# Patient Record
Sex: Female | Born: 1975 | Race: White | Hispanic: No | Smoking: Former smoker
Health system: Southern US, Community
[De-identification: ages and names within clinical notes are randomized; demographics above are authoritative.]

## PROBLEM LIST (undated history)

## (undated) DIAGNOSIS — D693 Immune thrombocytopenic purpura: Secondary | ICD-10-CM

## (undated) DIAGNOSIS — D689 Coagulation defect, unspecified: Secondary | ICD-10-CM

## (undated) DIAGNOSIS — R011 Cardiac murmur, unspecified: Secondary | ICD-10-CM

## (undated) DIAGNOSIS — F419 Anxiety disorder, unspecified: Secondary | ICD-10-CM

## (undated) DIAGNOSIS — K589 Irritable bowel syndrome without diarrhea: Secondary | ICD-10-CM

## (undated) DIAGNOSIS — Z141 Cystic fibrosis carrier: Secondary | ICD-10-CM

## (undated) DIAGNOSIS — B001 Herpesviral vesicular dermatitis: Secondary | ICD-10-CM

## (undated) DIAGNOSIS — IMO0002 Reserved for concepts with insufficient information to code with codable children: Secondary | ICD-10-CM

## (undated) DIAGNOSIS — D649 Anemia, unspecified: Secondary | ICD-10-CM

## (undated) DIAGNOSIS — Z8742 Personal history of other diseases of the female genital tract: Secondary | ICD-10-CM

## (undated) HISTORY — DX: Coagulation defect, unspecified: D68.9

## (undated) HISTORY — PX: ABDOMINAL HYSTERECTOMY: SHX81

## (undated) HISTORY — DX: Cystic fibrosis carrier: Z14.1

## (undated) HISTORY — DX: Personal history of other diseases of the female genital tract: Z87.42

## (undated) HISTORY — DX: Anemia, unspecified: D64.9

## (undated) HISTORY — PX: OTHER SURGICAL HISTORY: SHX169

## (undated) HISTORY — DX: Herpesviral vesicular dermatitis: B00.1

## (undated) HISTORY — DX: Irritable bowel syndrome, unspecified: K58.9

## (undated) HISTORY — DX: Reserved for concepts with insufficient information to code with codable children: IMO0002

---

## 1997-02-21 DIAGNOSIS — R87619 Unspecified abnormal cytological findings in specimens from cervix uteri: Secondary | ICD-10-CM

## 1997-02-21 DIAGNOSIS — IMO0002 Reserved for concepts with insufficient information to code with codable children: Secondary | ICD-10-CM

## 1997-02-21 HISTORY — DX: Reserved for concepts with insufficient information to code with codable children: IMO0002

## 1997-02-21 HISTORY — DX: Unspecified abnormal cytological findings in specimens from cervix uteri: R87.619

## 1997-08-05 ENCOUNTER — Other Ambulatory Visit: Admission: RE | Admit: 1997-08-05 | Discharge: 1997-08-05 | Payer: Self-pay | Admitting: Obstetrics and Gynecology

## 1997-11-17 ENCOUNTER — Ambulatory Visit (HOSPITAL_COMMUNITY): Admission: RE | Admit: 1997-11-17 | Discharge: 1997-11-17 | Payer: Self-pay | Admitting: Family Medicine

## 1997-11-17 ENCOUNTER — Encounter: Payer: Self-pay | Admitting: Family Medicine

## 1998-09-22 ENCOUNTER — Other Ambulatory Visit: Admission: RE | Admit: 1998-09-22 | Discharge: 1998-09-22 | Payer: Self-pay | Admitting: Obstetrics and Gynecology

## 1999-02-22 HISTORY — PX: SPLENECTOMY: SUR1306

## 1999-05-17 ENCOUNTER — Encounter: Payer: Self-pay | Admitting: Internal Medicine

## 1999-05-17 ENCOUNTER — Ambulatory Visit (HOSPITAL_COMMUNITY): Admission: RE | Admit: 1999-05-17 | Discharge: 1999-05-17 | Payer: Self-pay | Admitting: Internal Medicine

## 1999-09-08 ENCOUNTER — Emergency Department (HOSPITAL_COMMUNITY): Admission: EM | Admit: 1999-09-08 | Discharge: 1999-09-08 | Payer: Self-pay | Admitting: Emergency Medicine

## 1999-09-08 ENCOUNTER — Inpatient Hospital Stay (HOSPITAL_COMMUNITY): Admission: AD | Admit: 1999-09-08 | Discharge: 1999-09-10 | Payer: Self-pay

## 1999-10-26 ENCOUNTER — Other Ambulatory Visit: Admission: RE | Admit: 1999-10-26 | Discharge: 1999-10-26 | Payer: Self-pay | Admitting: Obstetrics and Gynecology

## 1999-12-20 ENCOUNTER — Encounter (INDEPENDENT_AMBULATORY_CARE_PROVIDER_SITE_OTHER): Payer: Self-pay

## 1999-12-21 ENCOUNTER — Inpatient Hospital Stay (HOSPITAL_COMMUNITY): Admission: EM | Admit: 1999-12-21 | Discharge: 1999-12-24 | Payer: Self-pay

## 2000-08-31 ENCOUNTER — Other Ambulatory Visit: Admission: RE | Admit: 2000-08-31 | Discharge: 2000-08-31 | Payer: Self-pay | Admitting: Obstetrics and Gynecology

## 2000-09-19 ENCOUNTER — Ambulatory Visit (HOSPITAL_COMMUNITY): Admission: RE | Admit: 2000-09-19 | Discharge: 2000-09-19 | Payer: Self-pay | Admitting: Obstetrics and Gynecology

## 2000-09-19 ENCOUNTER — Encounter: Payer: Self-pay | Admitting: Obstetrics and Gynecology

## 2001-01-10 ENCOUNTER — Ambulatory Visit (HOSPITAL_COMMUNITY): Admission: RE | Admit: 2001-01-10 | Discharge: 2001-01-10 | Payer: Self-pay | Admitting: Obstetrics and Gynecology

## 2001-01-10 ENCOUNTER — Encounter: Payer: Self-pay | Admitting: Obstetrics and Gynecology

## 2001-01-31 ENCOUNTER — Ambulatory Visit (HOSPITAL_COMMUNITY): Admission: RE | Admit: 2001-01-31 | Discharge: 2001-01-31 | Payer: Self-pay | Admitting: Obstetrics and Gynecology

## 2001-01-31 ENCOUNTER — Encounter: Payer: Self-pay | Admitting: Obstetrics and Gynecology

## 2001-02-07 ENCOUNTER — Inpatient Hospital Stay (HOSPITAL_COMMUNITY): Admission: AD | Admit: 2001-02-07 | Discharge: 2001-02-07 | Payer: Self-pay | Admitting: Obstetrics and Gynecology

## 2001-02-09 ENCOUNTER — Encounter (INDEPENDENT_AMBULATORY_CARE_PROVIDER_SITE_OTHER): Payer: Self-pay | Admitting: Specialist

## 2001-02-09 ENCOUNTER — Inpatient Hospital Stay (HOSPITAL_COMMUNITY): Admission: AD | Admit: 2001-02-09 | Discharge: 2001-02-12 | Payer: Self-pay | Admitting: Obstetrics and Gynecology

## 2001-02-13 ENCOUNTER — Encounter: Admission: RE | Admit: 2001-02-13 | Discharge: 2001-03-15 | Payer: Self-pay | Admitting: Obstetrics and Gynecology

## 2001-02-15 ENCOUNTER — Inpatient Hospital Stay (HOSPITAL_COMMUNITY): Admission: AD | Admit: 2001-02-15 | Discharge: 2001-02-15 | Payer: Self-pay | Admitting: Obstetrics and Gynecology

## 2001-02-15 ENCOUNTER — Encounter: Payer: Self-pay | Admitting: Obstetrics and Gynecology

## 2001-02-28 ENCOUNTER — Ambulatory Visit (HOSPITAL_COMMUNITY): Admission: RE | Admit: 2001-02-28 | Discharge: 2001-02-28 | Payer: Self-pay | Admitting: Obstetrics and Gynecology

## 2001-02-28 ENCOUNTER — Encounter: Payer: Self-pay | Admitting: Obstetrics and Gynecology

## 2001-05-23 ENCOUNTER — Ambulatory Visit (HOSPITAL_COMMUNITY): Admission: RE | Admit: 2001-05-23 | Discharge: 2001-05-23 | Payer: Self-pay | Admitting: Obstetrics and Gynecology

## 2001-05-23 ENCOUNTER — Encounter: Payer: Self-pay | Admitting: Obstetrics and Gynecology

## 2001-09-06 ENCOUNTER — Other Ambulatory Visit: Admission: RE | Admit: 2001-09-06 | Discharge: 2001-09-06 | Payer: Self-pay | Admitting: Obstetrics and Gynecology

## 2002-08-23 ENCOUNTER — Ambulatory Visit (HOSPITAL_COMMUNITY): Admission: RE | Admit: 2002-08-23 | Discharge: 2002-08-23 | Payer: Self-pay | Admitting: Obstetrics and Gynecology

## 2002-08-23 ENCOUNTER — Encounter: Payer: Self-pay | Admitting: Obstetrics and Gynecology

## 2002-09-27 ENCOUNTER — Other Ambulatory Visit: Admission: RE | Admit: 2002-09-27 | Discharge: 2002-09-27 | Payer: Self-pay | Admitting: Obstetrics and Gynecology

## 2003-01-01 ENCOUNTER — Encounter (INDEPENDENT_AMBULATORY_CARE_PROVIDER_SITE_OTHER): Payer: Self-pay | Admitting: Interventional Cardiology

## 2003-01-01 ENCOUNTER — Observation Stay (HOSPITAL_COMMUNITY): Admission: AD | Admit: 2003-01-01 | Discharge: 2003-01-02 | Payer: Self-pay | Admitting: Obstetrics and Gynecology

## 2003-01-07 ENCOUNTER — Encounter (INDEPENDENT_AMBULATORY_CARE_PROVIDER_SITE_OTHER): Payer: Self-pay

## 2003-01-07 ENCOUNTER — Inpatient Hospital Stay (HOSPITAL_COMMUNITY): Admission: AD | Admit: 2003-01-07 | Discharge: 2003-01-10 | Payer: Self-pay | Admitting: Obstetrics and Gynecology

## 2003-01-11 ENCOUNTER — Encounter: Admission: RE | Admit: 2003-01-11 | Discharge: 2003-02-10 | Payer: Self-pay | Admitting: Obstetrics and Gynecology

## 2003-01-16 ENCOUNTER — Inpatient Hospital Stay (HOSPITAL_COMMUNITY): Admission: AD | Admit: 2003-01-16 | Discharge: 2003-01-16 | Payer: Self-pay | Admitting: Obstetrics and Gynecology

## 2004-06-24 ENCOUNTER — Other Ambulatory Visit: Admission: RE | Admit: 2004-06-24 | Discharge: 2004-06-24 | Payer: Self-pay | Admitting: Obstetrics and Gynecology

## 2004-12-23 ENCOUNTER — Encounter: Admission: RE | Admit: 2004-12-23 | Discharge: 2004-12-23 | Payer: Self-pay | Admitting: Obstetrics and Gynecology

## 2004-12-29 ENCOUNTER — Encounter: Admission: RE | Admit: 2004-12-29 | Discharge: 2004-12-29 | Payer: Self-pay

## 2005-01-18 ENCOUNTER — Ambulatory Visit: Payer: Self-pay | Admitting: Infectious Diseases

## 2005-01-21 ENCOUNTER — Ambulatory Visit: Payer: Self-pay | Admitting: Infectious Diseases

## 2005-02-11 ENCOUNTER — Ambulatory Visit: Payer: Self-pay | Admitting: Infectious Diseases

## 2005-02-19 ENCOUNTER — Encounter: Admission: RE | Admit: 2005-02-19 | Discharge: 2005-02-19 | Payer: Self-pay | Admitting: Internal Medicine

## 2005-03-31 ENCOUNTER — Ambulatory Visit: Payer: Self-pay | Admitting: Infectious Diseases

## 2005-09-19 ENCOUNTER — Other Ambulatory Visit: Admission: RE | Admit: 2005-09-19 | Discharge: 2005-09-19 | Payer: Self-pay | Admitting: Obstetrics and Gynecology

## 2005-10-07 ENCOUNTER — Encounter: Admission: RE | Admit: 2005-10-07 | Discharge: 2005-10-07 | Payer: Self-pay

## 2008-08-12 ENCOUNTER — Encounter (INDEPENDENT_AMBULATORY_CARE_PROVIDER_SITE_OTHER): Payer: Self-pay | Admitting: Obstetrics & Gynecology

## 2008-08-12 ENCOUNTER — Ambulatory Visit (HOSPITAL_COMMUNITY): Admission: RE | Admit: 2008-08-12 | Discharge: 2008-08-12 | Payer: Self-pay | Admitting: Obstetrics & Gynecology

## 2010-03-23 ENCOUNTER — Other Ambulatory Visit (HOSPITAL_COMMUNITY): Payer: Self-pay | Admitting: Obstetrics and Gynecology

## 2010-03-23 DIAGNOSIS — N979 Female infertility, unspecified: Secondary | ICD-10-CM

## 2010-03-30 ENCOUNTER — Other Ambulatory Visit (HOSPITAL_COMMUNITY): Payer: Self-pay | Admitting: Obstetrics and Gynecology

## 2010-03-30 ENCOUNTER — Ambulatory Visit (HOSPITAL_COMMUNITY)
Admission: RE | Admit: 2010-03-30 | Discharge: 2010-03-30 | Disposition: A | Discharge status: Home / Self Care | Payer: BC Managed Care – PPO | Source: Ambulatory Visit | Attending: Obstetrics and Gynecology | Admitting: Obstetrics and Gynecology

## 2010-03-30 DIAGNOSIS — N979 Female infertility, unspecified: Secondary | ICD-10-CM | POA: Insufficient documentation

## 2010-03-31 ENCOUNTER — Ambulatory Visit (HOSPITAL_COMMUNITY): Payer: BC Managed Care – PPO

## 2010-04-12 ENCOUNTER — Ambulatory Visit (HOSPITAL_COMMUNITY): Payer: BC Managed Care – PPO

## 2010-05-31 LAB — ABO/RH: ABO/RH(D): A POS

## 2010-05-31 LAB — CBC
MCHC: 34 g/dL (ref 30.0–36.0)
MCV: 93.7 fL (ref 78.0–100.0)
RBC: 3.61 MIL/uL — ABNORMAL LOW (ref 3.87–5.11)

## 2010-07-06 NOTE — Op Note (Signed)
NAME:  Mary Duffy, Mary Duffy           ACCOUNT NO.:  [id number]   MEDICAL RECORD NO.:  [id number]          PATIENT TYPE:  AMB   LOCATION:  SDC                           FACILITY:  [facility]   PHYSICIAN:  Ilda Mori, M.D.   DATE OF BIRTH:  Mar 24, 1975   DATE OF PROCEDURE:  08/12/2008  DATE OF DISCHARGE:                               OPERATIVE REPORT   PREOPERATIVE DIAGNOSIS:  Voluntary termination of pregnancy.   POSTOPERATIVE DIAGNOSIS:  Voluntary termination of pregnancy.   PROCEDURE:  Dilatation and evacuation.   SURGEON:  Ilda Mori, MD   ANESTHESIA:  Paracervical block with IV sedation.   FINDINGS:  The uterus was mid position and top normal size.  On D and E,  products of conception were noted consistent with a 6-7 week  intrauterine pregnancy.   SPECIMENS:  POC and they were sent to pathology for confirmation.   ESTIMATED BLOOD LOSS:  Minimal.   COMPLICATIONS:  There were no complications.   INDICATIONS:  A 35 year old gravida 3, para 2 female, who developed  unplanned and unwanted intrauterine pregnancy, which was confirmed by  ultrasound on August 02, 2006.  According to the patient's wishes, she  presents for voluntary termination of pregnancy.   PROCEDURE:  The patient was taken to the operating room and placed in a  dorsal lithotomy position, where IV sedation was administered and the  perineum and vagina were prepped and draped in the sterile fashion.  A  10 mL of 2% lidocaine was infiltrated in the paracervical tissues.  The  anterior lip of the cervix was grasped with single-tooth tenaculum.  The  internal os was dilated with Shawnie Pons dilators to a 21-French and #7  suction curette was introduced into the endometrial cavity and the  products of conception were evacuated.  The procedure was then  terminated and the patient left the operating room in good condition.      Ilda Mori, M.D.  Electronically Signed     RK/MEDQ  D:  08/12/2008  T:  08/13/2008   Job:  [id number]

## 2010-07-09 NOTE — H&P (Signed)
NAME:  Mary Duffy, Mary Duffy NO.:  [id number]   MEDICAL RECORD NO.:  [id number]                   PATIENT TYPE:  MAT   LOCATION:  MATC                                 FACILITY:  [facility]   PHYSICIAN:  Osborn Coho, M.D.                DATE OF BIRTH:  [birth year]   DATE OF ADMISSION:  01/07/2003  DATE OF DISCHARGE:                                HISTORY & PHYSICAL   HISTORY:  This is a 35 year old gravida 2, para 1, 0, 0, 1, at 36-3/7th  weeks, who presents for an evaluation of labor.  She was seen at the office  with mild regular contractions, and a cervical examination of 170 and -2.  She denies leaking or bleeding, and reports positive fetal movement.  The  pregnancy has been followed by the M.D. service, and is remarkable for:  ITP, history of IUGR, history of abnormal Pap, previous C-section, desires a  repeat and Group-B Streptococcus positive.   Her OB history is remarkable for a low transverse cesarean section in 2002,  of a female infant at [redacted] weeks gestation, weighing 5 pounds and 9 ounces,  remarkable for IUGR and ITP, and a postoperative hematoma.   PAST MEDICAL HISTORY:  1. Remarkable for a history of an abnormal Pap.  2. Childhood varicella.  3. History of ITP since age 84.  4. History of a blood transfusion in 2001.  5. History of IBS with constipation.  6. History of a heart murmur which she was told was resolved, and no longer     a problem.  7. 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   PAST SURGICAL HISTORY:  1. Remarkable for a 1992, bone spur removed from her right ankle.  2. 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  3. In 2002, she had a cesarean section.   FAMILY HISTORY:  Remarkable for heart disease in her grandfather.  Hypertension in her mother, father and grandfather.  ITP in her brother.  Hypothyroidism in her mother.  Breast cancer in her grandmother.   Lung  cancer in her other grandmother.  Bipolar disorder in her father.   GENETIC HISTORY:  Remarkable for the patient being a carrier of cystic  fibrosis.   SOCIAL HISTORY:  Remarkable for the patient being married to Martelle, who is  involved and supportive.  She is of the [employer].  She works as an  Chemical engineer.  She denies any alcohol, tobacco, or drug use, although she  has smoked cigarettes in the past.   PRE-NATAL LABORATORY DATA:  Hemoglobin 12.1, platelets initially 98,000, and  last week they were 92,000.  Blood type A-positive, antibody screen  negative.  RPR nonreactive.  Rubella immune.  Hepatitis negative.  HIV  negative.  Gonorrhea negative.  Chlamydia negative.  Cystic fibrosis  positive.  Husband is cystic fibrosis negative.  Clot screen within normal  limits.  Group-B strep positive.   PHYSICAL EXAMINATION:  VITAL SIGNS:  Stable.  HEENT:  Within normal limits.  NECK:  Thyroid normal and not enlarged.  CHEST:  Clear to auscultation.  HEART:  A regular rate and rhythm.  ABDOMEN:  Gravid, 36 cm, vertex to Leopold's.  EFM shows a reactive fetal  heart rate with uterine contractions every three to five minutes.  PELVIC:  Cervical examination changed from 170 and -2 and posterior, to 275  mid-positive and -1 to -2, with a vertex presentation.  EXTREMITIES:  Within normal limits.   LABORATORY DATA:  A CBC shows a white count of 15.8, hemoglobin 11.6,  platelet count 92,000.   ASSESSMENT:  1. Anterior uterine pregnancy at 36-3/7th weeks.  2. Latent phase labor.  3. Idiopathic thrombocytopenic purpura.  4. Previous cesarean section, desires repeat.   PLAN:  Dr. Osborn Coho consulted and will proceed with a repeat low  transverse cesarean section, and further orders to follow.     Marie L. Williams, C.N.M.                 Osborn Coho, M.D.    MLW/MEDQ  D:  01/07/2003  T:  01/07/2003  Job:  [id number]

## 2010-07-09 NOTE — Discharge Summary (Signed)
   Mary Duffy, Mary Duffy                       ACCOUNT NO.:  [id number]   MEDICAL RECORD NO.:  [id number]                   PATIENT TYPE:   LOCATION:                                       FACILITY:   PHYSICIAN:  Hal Morales, M.D.             DATE OF BIRTH:   DATE OF ADMISSION:  01/01/2003  DATE OF DISCHARGE:  01/02/2003                                 DISCHARGE SUMMARY   ADMITTING DIAGNOSES:  1. Intrauterine pregnancy at 35-5/7 weeks.  2. Idiopathic thrombocytopenia.  3. Shortness of breath and complaints of palpitations.   DISCHARGE DIAGNOSES:  1. Intrauterine pregnancy at 35-6/7 weeks.  2. Resolved complaints of shortness of breath and palpitations.  3. Known diagnosis of mild congenital pulmonary stenosis.  4. Known diagnosis of idiopathic thrombocytopenia.   PROCEDURE:  None.   HOSPITAL COURSE:  Ms. Mary Duffy is a 35 year old married white female gravida  2, para 1-0-0-1 at 35-5/[redacted] weeks gestation with a known diagnosis of mild  pulmonary stenosis admitted for observation secondary to complaints of  increasing shortness of breath and increasing episodes of palpitations that  did not resolve with rest.  She was initially evaluated at [facility]  and then sent to [facility] for a 2-D echocardiogram and cardiology  consult.  That consult is still in process but her symptoms are improved  today.  She has no further shortness of breath or palpitations.  She has  been on continuous cardiac monitoring and has had no episodes of any  irregularities.  She has had a pulse between 96-120s throughout her hospital  stay.  Her O2 saturations are 95-97% and her fetal heart rate is reactive  and reassuring.  She has no regular uterine contractions.  Her platelets are  stable at 92,000.  She is requesting discharge and will be discharged upon  consent from cardiology.   DISCHARGE INSTRUCTIONS:  She is to call for signs and symptoms of labor,  vaginal bleeding, leaking,  increasing palpitations, or shortness of breath.   DISCHARGE FOLLOWUP:  Next week at [facility] on November 17.   DISCHARGE LABORATORIES:  Her CK-MB was 1.7, creatinine kinase 62.  TSH  0.992.  Her hemoglobin is 11.4, WBC count 13.7, platelets 92,000.  Her SGOT  is 18, SGPT less than 19.   DISCHARGE MEDICATIONS:  Continue on prenatal vitamins.     Mary Duffy, C.N.M.              Hal Morales, M.D.    SJD/MEDQ  D:  01/02/2003  T:  01/02/2003  Job:  [id number]

## 2010-07-09 NOTE — H&P (Signed)
NAME:  Mary Duffy, Mary Duffy                     ACCOUNT NO.:  [id number]   MEDICAL RECORD NO.:  [id number]                   PATIENT TYPE:  INP   LOCATION:  9374                                 [facility]ACILITY:  [facility]   PHYSICIAN:  Janine Limbo, M.D.            DATE OF BIRTH:  09/01/1975   DATE OF ADMISSION:  01/01/2003  DATE OF DISCHARGE:                                HISTORY & PHYSICAL   SUBJECTIVE:  Mrs. Mary Duffy is a 35 year old female gravida 2, para 1 at [redacted]  weeks gestation who presented to the office today with complaints of  shortness of breath and palpitations.  She has had similar symptoms in the  past but they usually would resolve and the difference today is that it did  not resolve.  Her pregnancy has been followed by the [facility]  MD service and has been remarkable for (1) history of IUGR, (2) history of  ITP, (3) history of abnormal Pap smear, (4) previous C-section desires  repeat, (5) history of palpitations.  Patient had a 2-D echo performed  approximately 3-4 months ago and was diagnosed with mild pulmonic stenosis  at that time with no further treatment.  She also wore an event monitor due  to her history of palpitations and during a 2-week time period she was  diagnosed with benign ectopic.  Patient had a negative cardiology evaluation  at [facility] this afternoon with a normal 2-D echo.   Her prenatal labs were collected on Jul 02, 2002.  Hemoglobin 12.1,  hematocrit 37.6, platelets 98,000, blood type A positive, antibody negative,  RPR nonreactive, rubella immune, hepatitis B surface antigen negative, HIV  nonreactive, gonorrhea negative, Chlamydia negative, cystic fibrosis was  positive but the husband was negative.  Platelets on July 31, 2002 were  90,000 and on August 28, 2002 were 82,000 and on September 27, 2002 her platelets  were 68,000.  Her 1-hour glucola from October 31, 2002 was 85 and her RPR  was nonreactive at that time.  Platelets on  December 09, 2002 were 92,000,  and platelets on January 01, 2003 were 92,000.   HISTORY OF PRESENT PREGNANCY:  She presented for care at [facility] on  Jul 01, 2002 at [redacted] weeks gestation by last menstrual period but 9-1/[redacted] weeks  gestation by ultrasound performed that day.  Patient expressed a desire to  have a repeat C-section versus a trial of labor.  Ultrasonography at [redacted]  weeks gestation shows growth consistent with previous dating.  At [redacted] weeks  gestation she reports palpitations.  At that time it was also recommended  that patient be seen by her hematologist at [facility] to consider therapy for her  thrombocytopenia.  Patient was able to see Dr. Armanda Magic in the  cardiology department.  She was placed on an event monitor.  Pregnancy  ultrasonography at [redacted] weeks gestation shows growth consistent with previous  dating.  Patient at 28  weeks reported that palpitations were less frequent.  Pregnancy ultrasonography at [redacted] weeks gestation shows growth consistent with  previous dating, EFW at 50%.  Rest of her prenatal care was unremarkable  until she presented this morning with the sudden onset of shortness of  breath.   PAST OBSTETRICAL HISTORY:  She is a gravida 2, para 1-0-0-1.  In December  2002 she had a cesarean section for a female infant weighing 5 pounds 9  ounces at [redacted] weeks gestation.  She had a spinal for anesthesia.  Infant's  name was Mary Duffy and was delivered by Dr. Dierdre Forth due to IUGR.  She  had a postop hematoma due to her ITP.   MEDICAL HISTORY:  She has no medication allergies.  She reports having had  the usual childhood illnesses.  She has used oral contraceptives in the  past.  She had an abnormal Pap smear in 1999 for which she received a  colposcopy and they have been within normal limits since.  She also has a  history of ITP since age 40.  She received a blood transfusion of 2 units in  2001.  She reports history of irritable bowel syndrome.    SURGICAL HISTORY:  Ankle surgery to remove a bone spur in 1992 as well as a  splenectomy in 2001 and a C-section in 2002.   FAMILY MEDICAL HISTORY:  Maternal grandfather with history of heart disease;  multiple family members with hypertension; patient's brother has ITP as  well;  mother has hypothyroidism; maternal grandmother with breast cancer;  paternal grandmother with lung cancer; father is bipolar.  Patient is a  previous smoker, she stopped in 2002.   GENETIC HISTORY:  Genetic history is remarkable for patient with heart  murmur that resolved and patient being a carrier of cystic fibrosis with  father of the baby negative.   SOCIAL HISTORY:  Patient is married to the father of the baby.  His name is  Jacquenette Shone.  He is involved and supportive.  They are both college educated and  employed full time.  They deny any alcohol, tobacco, or illicit drug use  with the pregnancy.   OBJECTIVE DATA:  VITAL SIGNS:  Stable; she is afebrile.  HEENT:  Grossly within normal limits.  CHEST:  Clear to auscultation.  HEART:  Regular rate and rhythm.  ABDOMEN:  Gravid in contour with fundal height extending approximately 36 cm  above pubic symphysis.  Electronic fetal monitoring is reactive and  reassuring with occasional uterine contractions.  PELVIC:  Cervix is closed and posterior per CNM in office, vertex, -1.   LABORATORIES:  Group B strep, gonorrhea, and Chlamydia are pending.  Telemetry shows sinus tachycardia.   ASSESSMENT:  1. Intrauterine pregnancy at 35 weeks.  2. Pulmonary stenosis with previous shortness of breath and palpitations.  3. Idiopathic thrombocytopenic purpura.   PLAN:  Admit to adult intensive care unit for 24 hours of telemetry  monitoring as well as continuous fetal monitoring.     Cam Hai, C.N.M.                     Janine Limbo, M.D.    KS/MEDQ  D:  01/01/2003  T:  01/01/2003  Job:  [id number]

## 2010-07-09 NOTE — Consult Note (Signed)
[place]  Patient:    Mary Duffy, Mary Duffy                     MRN: [id number] Proc. Date: 09/08/99 Adm. Date:  [id number] Attending:  Armandina Gemma Dictator:   Cheree Ditto                          Consultation Report  REFERRING PHYSICIAN:  Armandina Gemma, M.D.  REASON FOR CONSULTATION:  Headache, neck stiffness and fever.  HISTORY OF PRESENT ILLNESS:  This is an initial inpatient consultation on this 35 year old female with persistent thrombocytopenia thought to be due to ITP versus a congenital abnormality, who has undergone several treatments for ITP, most recently infusion of intravenous immunoglobulin on July 16 and July 17. She reports that she was well prior to these infusions.  On the night of July 16 she had a mild headache which was relieved with Tylenol.  Over the course of the second infusion and afterwards the headache worsened and she also notes that her neck was stiff.  She went to sleep and woke up and her headache has still remained about the same since that time.  She also reports nausea and some myalgias in the hip girdle.  She denies photophobia but does say it hurts to move her eyes.  She has felt feverish and temperature at home has been as high as 102.  She denies any rash.  She denies numbness, weakness, paraesthesias, or any pain in the other parts of her body.  She has not been confused and her mental status has been entirely intact.  She saw Dr. Phylliss Bob today and was subsequently admitted over a concern of possibly meningitis. She has not been around anyone who has been ill.  PAST MEDICAL HISTORY:  ITP as above.  FAMILY HISTORY:  Brother also has low platelets problem, ITP.  SOCIAL HISTORY:  She denies alcohol or tobacco use.  She is engaged.  MEDICATIONS:  Multivitamins.  PHYSICAL EXAMINATION:  GENERAL:  In general she is a well-nourished appearing female, who is lying quietly in a dark room.  She  appears mildly distressed due to pain.  HEENT:  Head:  Cranium is normocephalic and atraumatic.  Oropharynx is benign.  NECK:  Moderately stiff with some tenderness.  There is no Kernig or Babinski sign.  NEUROLOGICAL:  Mental status:  She is fully awake and alert.  She is completely oriented to person, place, time, and situation.  Short- and long-term memory, concentration and language are all intact.  She can abstract and calculate without difficulty.  Cranial nerves:  Discs are sharp. Spontaneous venous pulsations are present.  On the funduscopic examination pupils are equal, round and briskly reactive to light.  Extraocular movements are full.  Face is symmetrical.  Tongue and palate move briskly in the midline.  Motor:  Normal bulk and tone.  No atrophy or fasciculations.  Normal strength in all limbs.  Sensation is intact to light touch and vibration in all extremities.  Coordination is normal.  Cerebellar examination reveals normal finger-to-nose.  Gait is intact.  Deep tendon reflexes are 2+.  Toes are downgoing.  There are no pathological reflexes.  LABORATORY DATA:  CBC, coagulation studies, blood cultures and CT of the head are all pending at this time.  IMPRESSION:  A 35 year old female, status post intravenous immunoglobulin infusion over the last two days, now with fever, headache and  neck stiffness. Concern is for meningitis.  The only way to definitively exclude infectious meningitis would be cerebrospinal fluid examination but there is concern over performing lumbar puncture due to her hematologic disease.  However, given her present clinical status including completely normal mental status and no signs of increased intracranial pressure on examination, my clinical suspicion for infectious meningitis is low, and this most likely represents a systemic reaction to the gamma globulin infusion.  RECOMMENDATIONS: 1. Followup head CT. 2. Neuro checks q.4h. 3. Would  proceed with LP if her platelet count would permit this, however, if    it is low enough to be of concern and head CT is unremarkable it is    reasonable to defer it as long as she remains neurologically normal. 4. Will continue to follow. DD:  09/08/99 TD:  09/09/99 Job: [id number]

## 2010-07-09 NOTE — H&P (Signed)
[place]  Patient:    Mary Duffy, Mary Duffy                       MRN: [id number] Adm. Date:  09/08/99 Attending:  W. Chase Picket, M.D. Dictator:   Filomena Jungling, RN, GNP                         History and Physical  DATE OF BIRTH:  09/11/1975  CHIEF COMPLAINT:  Neck stiffness and fever.  HISTORY OF PRESENT ILLNESS:  The patient is a 35 year old single black female who is followed by Dr. Phylliss Bob in Hematology Clinic.  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  She does not have any accompanying rheumatology problems of note.  She has had a positive ANA but her ENA panel was normal.  She has had thrombocytopenia since 1985 with a family history of thrombocytopenia in her brother.  She has had prior normal bone marrow biopsy in 1990 at [facility].  She has been seen by Dr. Aline August at [facility] and was treated in April with IV Anti D globulin. She did not have any response to that medication.  She was therefore treated this week on Monday and Tuesday with IV IgG.  After the first infusion she had some headache and fever.  This improved with Tylenol and Benadryl.  After the second infusion though she developed a headache and fever that got worse overnight.  Today she was unable to ambulate due to severe pain and stiffness in her neck and she was brought to our office.  She denies any recent upper respiratory infection.  No tick bite or rash.  No one else in her family or close friends have been ill lately.  She denies any bruising or bleeding.  No vomiting or diarrhea.  No photophobia.  She complains mostly of pain and muscle tightness in her neck with some spasms in her back, legs and neck as well as a tremor.  There have been no other focal neurological signs.  She has not had any visual changes.  She has not had any vomiting.  The headache is mostly over the back of the head and the neck ache is causing  her more problem than the headache.  PAST MEDICAL HISTORY:  Fairly benign.  She has been evaluated for constipation by Dr. Juanda Chance.  She has had some mild pulmonary valve stenosis which is stable and quite mild.  Her past medical history is otherwise benign.  FAMILY HISTORY:  Notable for a similar condition in her brother.  SOCIAL HISTORY:  Negative for smoking or alcohol use.  She is engaged to be married.  She lives with her parents at the present time.  As noted, the only medication is multivitamin.  ALLERGIES:  She has no true drug allergies but does not aspirin.  REVIEW OF SYSTEMS:  Done with the patient as above.  She did have some hypotension yesterday.  This is somewhat better today. She could not tolerate any p.o. antiemetics today due to the nausea.  PHYSICAL EXAMINATION:  VITAL SIGNS:  Temperature 118/76, temperature 100.1, pulse 100, weight 121. DD:  09/08/99 TD:  09/08/99 Job: [id number]

## 2010-07-09 NOTE — Op Note (Signed)
[facility]  Patient:    Mary Duffy, Mary Duffy Visit Number: [id number] MRN: [id number]          Service Type: OBS Location: 910A 9115 [id number] Attending Physician:  Shaune Spittle Dictated by:   Maris Berger. Pennie Rushing, M.D. Proc. Date: 02/09/01 Admit Date:  02/09/2001                             Operative Report  PREOPERATIVE DIAGNOSES: 1. Intrauterine pregnancy at [redacted] weeks gestation. 2. Intrauterine growth retardation. 3. Idiopathic thrombocytopenic purpura.  POSTOPERATIVE DIAGNOSES: 1. Intrauterine pregnancy at [redacted] weeks gestation. 2. Intrauterine growth retardation. 3. Idiopathic thrombocytopenic purpura.  OPERATION:                    Primary low transverse cesarean section.  SURGEON:                      Vanessa P. Pennie Rushing, M.D.  ASSISTANTVance Gather Duplantis, C.N.M.  ANESTHESIA:                   Spinal.  ESTIMATED BLOOD LOSS:         Less than 750 cc.  COMPLICATIONS:                None.  FINDINGS:                     The uterus, tubes and ovaries were normal for the gravid state.  The patient was delivered of a female infant whose name is Chloe, weighing 5 pounds 9 ounces, with Apgars of 9 and 9 at one and five minutes respectively.  The placenta contained an eccentrically inserted three-vessel cord.  DESCRIPTION OF PROCEDURE:     The patient was taken to the operating room after appropriate identification and placed on the operating table.  After placement of a spinal anesthetic, she was placed in the supine position with a left lateral tilt.  The abdomen and perineum were prepped with multiple layers of Betadine and a Foley catheter inserted into the bladder under sterile conditions and connected to straight drainage.  The abdomen was draped as a sterile field.  After assurance of adequate anesthesia, a transverse incision was made suprapubically and the abdomen opened in layers.  The peritoneum  was entered.  The uterus was incised approximately 2 cm above the uterovesical fold and the infant delivered from the occiput transverse position with the aid of a Kiwi vacuum extractor which was placed at 500 mmHg suction.  The infant had the nares and pharynx suctioned and the cord clamped and cut.  The infant was handed off to the awaiting pediatricians.  At the patients request, cord blood for cord blood banking was then obtained.  The placenta was then noted to have separated from the uterus and was removed from the operating field.  The uterine incision was closed with a running interlocking suture and 0 Vicryl.  An imbricating suture of 0 Vicryl was then placed. Hemostasis was noted to be adequate and copiously irrigated was carried out. The abdominal peritoneum was closed with a running suture of 2-0 Vicryl.  The rectus muscles were reapproximated in the midline with a figure-of-eight suture of 2-0 Vicryl.  The rectus muscles were made hemostatic with Bovie cautery and irrigated.  The rectus fascia was closed with a running suture of 0 Vicryl and reinforced on either side of midline with figure-of-eight sutures of 0 Vicryl.  The subcutaneous tissue was made hemostatic with Bovie cautery. Skin staples were applied to the skin incision and a sterile dressing applied. The patient was taken from the operating room to the recovery room in satisfactory condition having been tolerated the procedure well with sponge and instrument counts correct.  The infant went to the full-term nursery. The placenta was sent to pathology. Dictated by:   Maris Berger. Pennie Rushing, M.D. Attending Physician:  Shaune Spittle DD:  02/09/01 TD:  02/10/01 Job: [id number]

## 2010-07-09 NOTE — Discharge Summary (Signed)
NAME:  Mary Duffy, Mary Duffy                     ACCOUNT NO.:  [id number]   MEDICAL RECORD NO.:  [id number]                   PATIENT TYPE:  INP   LOCATION:  9110                                 FACILITY:  [facility]   PHYSICIAN:  Osborn Coho, M.D.                DATE OF BIRTH:  May 06, 1975   DATE OF ADMISSION:  01/07/2003  DATE OF DISCHARGE:  01/10/2003                                 DISCHARGE SUMMARY   ADMISSION DIAGNOSES:  1. Intrauterine pregnancy  at 36-3/7 weeks.  2. Latent phase labor.  3. Idiopathic thrombocytopenic purpura.  4. Previous cesarean section, desires repeat.   DISCHARGE DIAGNOSES:  1. Intrauterine pregnancy  at 36-3/7 weeks.  2. Latent phase labor.  3. Idiopathic thrombocytopenic purpura.  4. Previous cesarean section, desires repeat.  5. Status post repeat low transverse cesarean section for a live born female     infant with Apgars 9 and 9, weighing 5 pounds 11 ounces.   PROCEDURE:  1. Epidural anesthesia.  2. Repeat low transverse cesarean section.  3. Removal of scar tissue.   HOSPITAL COURSE:  The patient was admitted at 36-3/7 weeks for evaluation of  labor. Her cervix changed during the course of observation and it was deemed  that she was in latent phase labor. The patient had experienced a previous  cesarean section and was requesting a repeat cesarean section. She was taken  to the operating room and prepped for repeat cesarean section, which was  performed by Dr. Su Hilt under epidural anesthesia. Surgery was remarkable  for adhesions and removal of keloid scar tissue. A live born female infant  was delivered. Apgars 9 and 9. Weighing 5 pounds 11 ounces. Mother and baby  were both taken to the mother baby unit in stable condition. On  postoperative day 1, the patient was doing well except for some shoulder  pain with abdominal palpation. Her examination was found to be within normal  limits. Her hemoglobin dropped from 11.6 to 9.1 to 8.0 but the  patient  remained hemodynamically stable and did not experience any syncope. Platelet  count was initially 92,000 and fell to a nadir of 78,000. This will be  rechecked through her hematologist at a later time. On postoperative day 2,  the patient was doing well with up ad lib. Tolerating a regular diet and not  experiencing any complications. On postoperative day 3, the patient was  ready to go home.   PHYSICAL EXAMINATION:  VITAL SIGNS:  Vital signs were stable. She was  afebrile.  CHEST:  Clear.  HEART:  Regular rate and rhythm.  ABDOMEN:  Soft and appropriately tender. Incision was clean, dry and intact  with Silicone patch in place. Lochia was small.  EXTREMITIES:  Within normal limits.   DISPOSITION:  The patient was deemed to have received the full benefit of  her hospital stay and was discharged to home.   DISCHARGE MEDICATIONS:  1. Motrin 600  mg p.o. q. 6 hours p.r.n.  2. Tylox 1 to 2 p.o. q. 4 hours p.r.n.   DISCHARGE LABORATORY DATA:  White blood cell count 16.5. Hemoglobin 8.0.  Platelet count 78,000 with schistocytes and large platelets present.  Followup labs on January 10, 2003 were white blood cell count 16.4,  hemoglobin 8.4, platelet count is currently pending.   DISCHARGE INSTRUCTIONS:  Per [facility] and Gynecologic  Services handout.   FOLLOW UP:  In 6 weeks at [facility] and Gynecologic  Services and in January with hematology at [facility].     Marie L. Williams, C.N.M.                 Osborn Coho, M.D.    MLW/MEDQ  D:  01/10/2003  T:  01/10/2003  Job:  [id number]

## 2010-07-09 NOTE — Discharge Summary (Signed)
[facility]  Patient:    Mary Duffy, Mary Duffy                     MRN: [id number] Adm. Date:  [id number] Disc. Date: [id number] Attending:  Meredith Leeds CC:         Helene Kelp, M.D.  Aline August, M.D. 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  She had a number of therapies, including immunoglobulin and steroids.  She continues to have a low platelet count, and had no bleeding problem.  Latest platelet count was 31,000.  The patient was treated by Dr. Pascal Lux at [facility].  He had recommended splenectomy, and the patient was admitted to the hospital to have that done.  PAST MEDICAL HISTORY:  Unremarkable beyond the above.  MEDICATIONS:  Vitamins, occasional laxative and occasional Prilosec.  ALLERGIES:  ASPIRIN.  PHYSICAL EXAMINATION:  Totally unremarkable.  See history and physical for greater details.  HOSPITAL COURSE:  On the day of admission, under general anesthesia, she underwent laparoscopy and attempted laparoscopic splenectomy.  It was found that the short gastric vessels were numerous, and the stomach was extremely tightly adherent to the spleen.  Despite very careful dissection and lengthy effort, I was unable to successfully separate the stomach from the splenic hilum and the spleen, and bleeding ensued.  After adequate efforts to control the bleeding laparoscopically, I abandoned the laparoscopic approach and did open splenectomy.  The operation and recovery were unremarkable.  Platelet count rose to 275,000 with platelet transfusion, and had fallen to 190,000 at discharge.  The patient had no postoperative complications.  She is to see me at the office in 2 to 3 weeks.  Staples were removed prior to discharge.  The pathology on the spleen was unremarkable.  DIAGNOSIS:  Immune related  thrombocytopenia.  OPERATION:  Splenectomy.  CONDITION ON DISCHARGE:  Improved. DD:  01/24/00 TD:  01/24/00 Job: [id number]

## 2010-07-09 NOTE — H&P (Signed)
[facility]  Patient:    Mary Duffy, Mary Duffy                     MRN: [id number] Adm. Date:  [id number] Disc. Date: [id number] Attending:  Armandina Gemma                         History and Physical  CHIEF COMPLAINT:  Low platelets.  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  She has been treated by Dr. Lucas Mallow and by Dr. Aline August at [facility].  Steroids and immunoglobulins have been tried, but have not been successful in chronically raising her platelet count.  The IV immunoglobulin caused a febrile response.  She has been on prednisone; I believe that has been stopped.  Latest platelet count was 31,000.  The patient is felt to be a candidate for splenectomy by Dr. Pascal Lux and is seeing me and admitted for that purpose.  I have counseled her thoroughly for the possible complications including the need for an open procedure, although laparoscopic procedure is anticipated, bleeding, infectious complications in the postoperative period and chronically, and injury to the stomach.  She accepts the risks and asks that I perform the procedure.  PAST MEDICAL HISTORY:  The patients health is good.  Childhood illnesses: Unremarkable.  OUTPATIENT MEDICATIONS:  Vitamins.  She occasionally needs a laxative.  She sometimes takes Prilosec.  ALLERGIES:  She is allergic to ASPIRIN.  PAST SURGICAL HISTORY:  She has had an operation on her Achilles tendon and removal of a bone spur.  No other medical problems.  FAMILY HISTORY:  Unremarkable.  REVIEW OF SYSTEMS:  Unremarkable.  PHYSICAL EXAMINATION:  VITAL SIGNS:  Unremarkable per nursing staff.  GENERAL APPEARANCE:  The patient is a healthy-appearing young woman.  She is slender.  MENTAL STATUS:  Normal.  HEENT:  Unremarkable.  CHEST:  Clear to auscultation.  BREASTS:   Normal.  CARDIOVASCULAR:  Heart rate and rhythm normal, no murmur or gallop.  ABDOMEN:  No mass, tenderness, or organomegaly.  RECTAL/PELVIC:  Not done.  EXTREMITIES:  Normal.  NEUROLOGIC:  Normal.  SKIN:  Normal.  LYMPHATICS:  Lymph nodes not enlarged.  IMPRESSION:  Immune thrombocytopenic purpura.  PLAN:  Laparoscopic splenectomy. DD:  12/20/99 TD:  12/20/99 Job: [id number]

## 2010-07-09 NOTE — Op Note (Signed)
NAME:  Mary Duffy, Mary Duffy                     ACCOUNT NO.:  [id number]   MEDICAL RECORD NO.:  [id number]                   PATIENT TYPE:  INP   LOCATION:  9110                                 FACILITY:  [facility]   PHYSICIAN:  Osborn Coho, M.D.                DATE OF BIRTH:  22-May-1975   DATE OF PROCEDURE:  01/07/2003  DATE OF DISCHARGE:                                 OPERATIVE REPORT   PREOPERATIVE DIAGNOSES:  1. Intrauterine pregnancy at 36-4/7 weeks.  2. Latent labor.  3. History of idiopathic thrombocytopenic purpura.  4. Desires repeat elective cesarean section.   POSTOPERATIVE DIAGNOSES:  1. Intrauterine pregnancy at 36-4/7 weeks.  2. Latent labor.  3. History of idiopathic thrombocytopenic purpura.  4. Desires repeat elective cesarean section.   PROCEDURE:  Repeat low transverse cesarean section via Pfannenstiel at the  site of the prior scar, with vacuum assistance.   ANESTHESIA:  Epidural.   ATTENDING:  Osborn Coho, M.D.   ASSISTANT:  Elby Showers. Williams, C.N.M.   FLUIDS REPLACED:  3500 mL.   ESTIMATED BLOOD LOSS:  800 mL.   URINE OUTPUT:  400 mL.   COMPLICATIONS:  None.   FINDINGS:  A live female infant with Apgars of 9 at one minute and 9 at five  minutes.  Normal bilateral ovaries and tubes.   PATHOLOGY:  Placenta sent.   DESCRIPTION OF PROCEDURE:  The patient was taken to the operating room after  the risks, benefits, and alternatives were discussed with the patient and  the patient had verbalized understanding and consent signed and witnessed.  The patient was given an epidural per anesthesia and prepped and draped in  the normal sterile fashion.  A skin incision was made at the site of the  prior Pfannenstiel scar.  The incision was carried down to the underlying  layer of fascia and the fascia was excised bilaterally in the midline.  The  fascial incision was extended bilaterally with the Mayo scissors.  Straight  Kocher clamps were placed on the  superior aspect of the fascial incision and  the rectus muscle excised from the fascia.  The same was done on the  inferior aspect of the fascial incision.  As the patient was feeling some  discomfort during this part of the procedure, 3% Nesacaine was administered  topically to this area.  The muscle was separated in the midline with a  hemostat and the peritoneum entered with the Metzenbaum scissors after  flashing and noted to be clear.  The main portion of the muscle inferiorly  was separated with the Bovie.  The peritoneum was extended manually.  Bladder blade was placed and bladder flap created.  The lower uterine  segment was noted to be extremely thin with an apparent window.  The uterine  incision was made and copious clear fluid was noted.  The infant's head was  having some difficulty delivering just with fundal pressure and  vacuum  applied.  With vacuum assistance the head delivered without difficulty.  The  oropharynx and nasopharynx were bulb-suctioned.  A loose nuchal cord was  noted and reduced without difficulty.  The remainder of the infant was  delivered and the cord was clamped and cut.  The infant was handed to the  waiting pediatricians.  Cord bloods were sent.  The placenta was delivered  via fundal massage.  The uterine incision was repaired with 0 Vicryl in a  running locked fashion after clearing the uterus of all clots and debris.  A  second imbricating layer using 0 Vicryl was performed.  The bilateral  ovaries and fallopian tubes were noted and appeared within normal limits.  The intra-abdominal cavity was copiously irrigated.  With the patient's low  platelets, Surgicel was placed on the uterine incision, which was noted to  be hemostatic.  Prior to placing the Surgicel, 3-0 chromic was placed on the  posterior aspect of the bladder where there was some oozing noted.  The  Surgicel covered this area as well after being placed.  The muscle was  reapproximated  with 0 chromic using interrupteds x3.  The fascia was  repaired with 0 Vicryl in a running fashion.  The subcutaneous tissue was  made hemostatic with the Bovie.  The prior uterine scar was removed as it  was very thick and appeared to be either a keloid or hypertrophic scarring.  There was some bleeding noted after removal of the scar, and it was made  hemostatic with the Bovie.  Chromic 3-0 was used to reapproximate the  subcutaneous tissue using three interrupted stitches.  The subcuticular  layer was closed with 3-0 Monocryl.  A silicone gel patch dressing was  applied in order to avoid hypertrophy of the scar or keloiding.  The patient  was informed that she may again develop the type of scar that she developed  last time.  Pressure dressing was applied.  Sponge, lap, and needle count  was correct.  The patient tolerated the procedure well and was returned to  the recovery room in stable condition.                                               Osborn Coho, M.D.    AR/MEDQ  D:  01/07/2003  T:  01/08/2003  Job:  [id number]

## 2010-07-09 NOTE — H&P (Signed)
[place]  Patient:    Mary Duffy, Mary Duffy                       MRN: [id number] Adm. Date:  09/08/99 Attending:  W. Chase Picket, M.D. Dictator:   Filomena Jungling, RN, GNP                         History and Physical  DATE OF BIRTH:  Aug 26, 1975  CHIEF COMPLAINT:  Neck stiffness and fever.  HISTORY OF PRESENT ILLNESS:  The patient is a 35 year old single black female who is followed by Dr. Phylliss Bob in the rheumatology clinic.  She has a past history of chronic idiopathic thrombocytopenia.  She does not have any accompanying rheumatology problem of note.  She has had a positive ANA, but her ENA panel was normal.  She has had thrombocytopenia since 1985 with a family history of thrombocytopenia in her brother.  She has had prior normal bone marrow biopsy in 1990 at [facility]  She has been seen by Dr. Aline August at [facility] and was treated in April with IV anti-D globulin.  She did not have any response to that medication.  She was, therefore, treated this week on Monday and Tuesday with IV IgG.  After the first infusion, she had some headache and fever.  This improved with Tylenol and Benadryl.  After the second infusion, though, she developed a headache and fever that got worse overnight.  Today, she was unable to ambulate due to severe pain and stiffness in her neck, and she was brought to our office.  She denies any recent upper respiratory tract infection or tick bite or rash.  No one else in her family or close friends have been ill lately.  She denies any bruising or bleeding.  No vomiting or diarrhea.  No photophobia.  She complains mostly of pain and muscle tightness in her neck with some spasms in her back and legs and neck as well as a tremor.  There have been no other focal neurologic signs.  She has not had any visual changes.  She has not had any vomiting.  The headache is mostly over the back of the head, and  the neck ache is causing her more problem than the headache.  PAST MEDICAL HISTORY:  Fairly benign.  She has been evaluated for constipation by Dr. Juanda Chance.  She has had some mild pulmonary valve stenosis which is stable and quite mild.  Her past medical history is otherwise benign.  FAMILY HISTORY:  Notable for a similar condition in her brother.  SOCIAL HISTORY:  Negative for smoking or alcohol use.  She is engaged to be married but lives with her parents at the present time.  MEDICATIONS:  As noted, only medication is multivitamin.  ALLERGIES:  She has no true drug allergies but does not take ASPIRIN.  REVIEW OF SYSTEMS:  Done with the patient as above.  She did have some hypotension yesterday.  This is somewhat better today.  She could not tolerate any p.o. antiemetics today due to the nausea.  PHYSICAL EXAMINATION:  VITAL SIGNS:  Blood pressure 118/76, temperature 100.1, pulse 100, weight 121.  GENERAL:  She is alert and oriented and in no acute distress.  Initially as she was in the waiting room waiting to go to the hospital, she had an increase in her neck pain and then was  quite tearful.  Due to increasing pain and inability to ambulate, we did have to transfer her by ambulance.  NECK:  She had quite a bit of stiffness with flexion and extension.  She was able to do rotation fairly well.  No cervical adenopathy.  LUNGS:  Clear.  HEART:  Rate regular.  I do not hear a murmur.  ABDOMEN:  Totally benign.  No organomegaly or masses.  SKIN:  No petechiae or skin rash noted.  NEUROLOGIC:  Nonfocal.  She has no clonus.  Strength is normal and symmetric in the upper and lower extremities, and her DTRs are normal and symmetric. Grips are equal.  HEENT:  Funduscopic exam unremarkable.  ASSESSMENT/PLAN:  The patient was seen by Dr. Phylliss Bob today.  He did speak with Dr. Pascal Lux.  We will go ahead and admit her for fever and nuchal rigidity in order to rule out meningitis.  We  will go ahead and get a stat head CT, blood cultures, labs. She will be seen by Dr. Sharene Skeans or one of his partners.  She has seen Dr. Sharene Skeans before.  We will give her some IV Demerol and Phenergan.  We will not give her any nonsteroidals.   Will also get labs to include clotting time, platelet count, and metabolic panel. DD:  09/08/99 TD:  09/08/99 Job: [id number]

## 2010-07-09 NOTE — H&P (Signed)
[facility]  Patient:    Mary Duffy, PFARR Visit Number: [id number] MRN: [id number]          Service Type: OBS Location: [facility] Attending Physician:  Leonard Schwartz Dictated by:   Saverio Danker, C.N.M. Admit Date:  02/07/2001                           History and Physical  HISTORY OF PRESENT ILLNESS:   The patient is a 35 year old, primigravida at 39-2/7 weeks admitted for primary low transverse cesarean section secondary to history of idiopathic thrombocytopenia and IUGR. She denies any nausea, vomiting, headaches, or visual disturbances. She denies any leaking or vaginal bleeding.  PRENATAL COURSE:              Her pregnancy has been followed at [facility] by the MD Service and has been at risk for IUGR and history of idiopathic thrombocytopenia throughout this pregnancy. She also has some borderline blood pressure elevations and has been maintained on bed rest since December 11. Other risk factors for this pregnancy are history of abnormal Pap and cystic fibrosis carrier, though husband is negative.  OBSTETRIC/GYNCOLOGIC HISTORY:                      She is a primigravida with an LMP of April 24, 2000, given her [facility] of January 30, 2001, but by ultrasound her [facility] is February 16, 2001. Her periods were irregular every 6-8 weeks. She has had a history of thrombocytopenia prior to this pregnancy and has been followed by her hematologist at [facility] and has had platelet counts throughout this pregnancy. Her most recent platelet count was 52,000.  ALLERGIES:                    She is not able to take aspirin secondary to thrombocytopenia.  PAST MEDICAL HISTORY:         She reports having had the usual childhood diseases.  She reports, again, a history of thrombocytopenia diagnosed at age 35. She has had blood transfusions. She had 2 units in October of 2001. She has a history of irritable bowel syndrome and chronic  constipation, history of occasional urinary tract infections.  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  FAMILY HISTORY:               Significant for maternal grandfather with heart disease. Mother, father, maternal grandfather with hypertension, on medication. Brother also with thrombocytopenia and mother with hypothyroidism. Maternal grandmother with breast cancer. Paternal grandmother with lung cancer. Father has bipolar disorder.  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  SOCIAL HISTORY:               She is married to [employer], who is involved and supportive. They are both employed full-time. They are of the [employer] faith. They deny any illicit drug use, alcohol or smoking with this pregnancy, though she did smoke early on in her pregnancy.  PRENATAL LABORATORY DATA:     Her blood type is A positive.  Her antibody screen is negative.  Syphilis is nonreactive.  Rubella is immune.  Hepatitis B surface antigen is negative. HIV is nonreactive.  GC  and Chlamydia are both negative.  Pap is within normal limits.  Her one-hour Glucola was within normal range. Her maternal serum alpha-fetoprotein was also within normal range and her 36-week beta strep is negative.  PHYSICAL EXAMINATION:  VITAL SIGNS:                  Her blood pressure is 126/80. The rest of her vital signs are stable.  She is afebrile.  HEENT:                        Grossly within normal limits.  HEART:                        Regular rhythm and rate.  CHEST:                        Clear.  BREASTS:                      Soft and nontender.  ABDOMEN:                      Gravid with irregular mild uterine contractions. Her fetal heart rate is 140 per Doppler and the patient was sent to maternity admissions for an NST on February 07, 2001.  PELVIC:                        Her cervix was deferred at this time.  EXTREMITIES:                  Within normal limits.  ASSESSMENT:                   1. Intrauterine pregnancy, at 39-2/7 weeks.                               2. History of idiopathic thrombocytopenia.                               3. Intrauterine growth restriction.                               4. Desire primary low transverse cesarean                                  section.  PLAN:                         Admit for cesarean section per Dr. Dierdre Forth. Dictated by:   Vance Gather Duplantis, C.N.M. Attending Physician:  Leonard Schwartz DD:  02/07/01 TD:  02/07/01 Job: [id number]

## 2010-07-09 NOTE — Op Note (Signed)
[facility]  Patient:    Mary Duffy, Mary Duffy                     MRN: [id number] Proc. Date: 12/20/99 Adm. Date:  [id number] Disc. Date: [id number] Attending:  Armandina Gemma                           Operative Report  PREOPERATIVE DIAGNOSIS:  Immune thrombocytopenic purpura.  POSTOPERATIVE DIAGNOSIS:  Immune thrombocytopenic purpura.  OPERATION:  Splenectomy.  SURGEON:  Zigmund Daniel, M.D.  ASSISTANT:  Rose Phi. Maple Hudson, M.D.  ANESTHESIA:  General.  DESCRIPTION OF PROCEDURE:  After adequate general anesthesia and monitoring, insertion of Foley catheter, and positioning of the patient with the left side somewhat up but supine, I made an infraumbilical incision transversely, dissected through the fascia, entered the peritoneum bluntly, and put in a Hasson cannula.  I then after securing the Hasson cannula inflated the abdomen with CO2 and did laparoscopy.  I could easily see the spleen.  I saw no abnormalities of any organs.  There was no abnormality noted in the omentum, specifically no accessory spleen.  I then placed additional ports in the upper abdomen and placed the patient in Trendelenburg and retracted omentum and colon upward and inspected the small bowel and its mesentery and saw no evidence of an accessory spleen.  I placed two ports in the left lower quadrant, then tilted the patient sharply to the right and put the patient in sharp reverse Trendelenburg position.  I then pulled the colon and omentum downward, and the spleen came into view and I retracted the stomach to the right with a Babcock clamp, exposing the splenic hilum.  There were some adhesions of the splenic flexure of the colon to the lateral abdominal wall, and I took those down with the Harmonic scalpel.  I worked then medially and anteriorly, exposing the lower pole of the spleen but leaving the lateral attachments intact.  I worked cephalad along the peritoneum of the  anterior surface of the spleen.  It appeared that the patient had a diffuse distribution of splenic vessels into the hilum.  Staying quite close to the spleen, I took a couple of small lower pole vessels using the Harmonic scalpel and additionally a clip on the side remaining anteriorly.  As I worked upward, it was difficult to see the short gastric arteries because in the upper portion of the hilum along the superior pole of the spleen, the stomach was extremely close to the spleen.  It actually seemed to be adherent to it rather than giving any effect of mesentery or gastrosplenic ligament.  Careful use of the Harmonic scalpel to divide the peritoneum across the anterior aspect of the hilum and attempted to create a plane of dissection between the stomach and the spleen, so as to approach the superior aspect of the splenic hilum and the short gastric vessels.  While I was doing that dissection, I began to have bleeding in the hilar area of the spleen.  I attempted to control this bleeding by suctioning it away and the use of clips and Harmonic scalpel. After a goodly time spent trying to get control of the bleeding so that I could see for further dissection, I felt that I was not going to be able to safely control that bleeding and decided to abandon the laparoscopic technique.  We released the retractors, removed the band retractor from  the left lower quadrant, and removed the Babcock and then removed all the ports. I then made a short left subcostal incision utilizing one of the port sites and extending it just below the ribs laterally.  Then entered the abdomen after dividing the rectus muscles with cautery.  I put the patient in reverse Trendelenburg position and put my hand in and pulled the spleen anteriorly and cut the lateral attachments of the spleen to the diaphragm and retroperitoneum.  I then delivered the spleen upward.  In so doing, I felt that there was some tearing of the  short gastric vessels as they came up.  I placed a pack posteriorly.  I then dissected very carefully at the hilum and clamped, cut, and divided the splenic artery and vein.  I suture ligated those vessels with 2-0 silk.  I then placed a clamp across a portion of the short gastric vessels and suture ligated that with 2-0 silk.  I then very carefully inspected the area around the greater curvature of the stomach and superior portion of the hilum and found a couple of small bleeding vessels, which I ligated or suture ligated.  I then had good hemostasis.  I thoroughly irrigated the area and removed the irrigant, again checked hemostasis and found it to be excellent.  I then carefully inspected the area and found that there was no evidence of injury of the pancreatic tail or stomach wall.  I then removed the packs.  I then carefully inspected the omentum, area of splenic hilum, and small bowel mesentery, and found no evidence of any accessory spleen.  The patient remained stable throughout.  I closed the fascia with #1 PDS in two layers and closed the skin of all incisions with staples.  The umbilical incision was closed with the pursestring suture which had been used to secure the Hasson cannula.  The patient was stable throughout.  Sponge, needle, and instrument counts were correct.  The patient went to the recovery room in stable condition. DD:  12/20/99 TD:  12/20/99 Job: [id number]

## 2010-07-09 NOTE — Discharge Summary (Signed)
[facility] of [facility]  Patient:    Mary Duffy, ANCONA Visit Number: [id number] MRN: [id number]          Service Type: OBS Location: 910A [id number] [id number] Attending Physician:  Shaune Spittle Dictated by:   Nigel Bridgeman, C.N.M. Admit Date:  02/09/2001 Discharge Date: 02/12/2001                             Discharge Summary  ADMISSION DIAGNOSES:          1. Intrauterine pregnancy at term.                               2. Idiopathic thrombocytopenic purpura.                               3. Intrauterine growth retardation.  DISCHARGE DIAGNOSES:          1. Intrauterine pregnancy at term.                               2. Idiopathic thrombocytopenic purpura.                               3. Intrauterine growth retardation.                               4. Postpartum anemia.  PROCEDURES:                   1. Primary low transverse cesarean section.                               2. Spinal anesthesia.  HOSPITAL COURSE:              Ms. Roxanna Mew is a 35 year old gravida 1, para 0, at 71 weeks, who presented on February 09, 2001 for primary low transverse cesarean section, scheduled secondary to idiopathic thrombocytopenic purpura and IUGR.  Patients platelets had reached a Nader of approximately 52,000 at 38 weeks.  On admission, her platelet count was 114,000.  She was taken to the operating room, where a primary low transverse cesarean section was performed with ______ by Dr. Pennie Rushing with a delivery of a viable female by the name of Chloe.  Weight 5 pounds 9 ounces.  Apgars were 9 and 9.  Infant was taken to the full term nursery.  Mother was taken to the recovery room in good condition.  Estimated blood loss was less than 750 cc.  By postoperative day #1, patient had been out of bed some.  There was some lightheadedness.  Her temperature was normal.  Blood pressure was 140/88.  CBC showed hemoglobin of 8.3, with a 12 preoperative value.  WBC was 17,000 and  platelets were 105,000. Urine was examined for protein, which was negative.  Orthostatic blood pressure and pulses were checked, which were normal.  CBC was rechecked on the following day, with a platelet count of 105,000, hemoglobin of 8.3 and WBC count of 17.6.  These were repeated on  February 11, 2001, with a hemoglobin of 7.8, platelet count of 214,000 and WBC count of 22.8.  She remained afebrile.  The rest of her hospital course was uncomplicated.  Her incision maintained itself as intact.  By postoperative day #3, she was up ad lib and doing well.  She was having no syncope.  Baby was breast-feeding.  She was undecided regarding contraception.  The patient was deemed to receive the full benefit of her hospital stay and was discharged home.  DISCHARGE INSTRUCTIONS:       Per [facility] handout.  DISCHARGE MEDICATIONS:        1. Tylox 1-2 p.o. q.3-4h. p.r.n. pain.                               2. Tylenol 325 mg 1-2 p.o. q.3-4h. p.r.n. pain.  DISCHARGE FOLLOW-UP:          Will occur in six weeks at [facility]. The patient will continue follow-up with her hematologist. Dictated by:   Nigel Bridgeman, C.N.M. Attending Physician:  Shaune Spittle DD:  02/12/01 TD:  02/13/01 Job: [id number]

## 2011-03-29 ENCOUNTER — Encounter (HOSPITAL_COMMUNITY): Payer: Self-pay | Admitting: *Deleted

## 2011-04-01 ENCOUNTER — Other Ambulatory Visit: Payer: Self-pay | Admitting: Obstetrics and Gynecology

## 2011-04-15 ENCOUNTER — Other Ambulatory Visit (HOSPITAL_COMMUNITY): Payer: BC Managed Care – PPO

## 2011-04-23 ENCOUNTER — Encounter (HOSPITAL_COMMUNITY): Admission: RE | Payer: Self-pay | Source: Ambulatory Visit

## 2011-04-23 ENCOUNTER — Ambulatory Visit (HOSPITAL_COMMUNITY)
Admission: RE | Admit: 2011-04-23 | Payer: BC Managed Care – PPO | Source: Ambulatory Visit | Admitting: Obstetrics and Gynecology

## 2011-04-23 HISTORY — DX: Anxiety disorder, unspecified: F41.9

## 2011-04-23 HISTORY — DX: Immune thrombocytopenic purpura: D69.3

## 2011-04-23 SURGERY — DILATATION & CURETTAGE/HYSTEROSCOPY WITH RESECTOCOPE
Anesthesia: Choice

## 2011-05-09 ENCOUNTER — Encounter: Payer: Self-pay | Admitting: Obstetrics and Gynecology

## 2011-05-16 ENCOUNTER — Encounter: Payer: Self-pay | Admitting: Obstetrics and Gynecology

## 2011-05-30 ENCOUNTER — Encounter: Payer: Self-pay | Admitting: Obstetrics and Gynecology

## 2011-07-12 ENCOUNTER — Telehealth: Payer: Self-pay | Admitting: Obstetrics and Gynecology

## 2011-07-27 ENCOUNTER — Other Ambulatory Visit: Payer: Self-pay | Admitting: Obstetrics and Gynecology

## 2011-07-27 ENCOUNTER — Telehealth: Payer: Self-pay | Admitting: Obstetrics and Gynecology

## 2011-07-27 NOTE — Telephone Encounter (Signed)
Hysteroscopy w/ Resection; D&C scheduled for 09/16/11 @ 11:15 with AVS.  BCBS effective 08/22/10.  Plan pays 80/20 after a $2,500 deductible. Pre-op due $407.47 -Adrianne Pridgen

## 2011-08-22 HISTORY — PX: HYSTEROSCOPY WITH RESECTOSCOPE: SHX5395

## 2011-08-24 ENCOUNTER — Encounter (HOSPITAL_COMMUNITY): Payer: Self-pay | Admitting: *Deleted

## 2011-09-06 ENCOUNTER — Encounter: Payer: Self-pay | Admitting: Obstetrics and Gynecology

## 2011-09-06 ENCOUNTER — Ambulatory Visit (INDEPENDENT_AMBULATORY_CARE_PROVIDER_SITE_OTHER): Payer: BC Managed Care – PPO | Admitting: Obstetrics and Gynecology

## 2011-09-06 VITALS — BP 112/64 | Temp 98.9°F | Ht 60.0 in | Wt 115.0 lb

## 2011-09-06 DIAGNOSIS — N920 Excessive and frequent menstruation with regular cycle: Secondary | ICD-10-CM | POA: Insufficient documentation

## 2011-09-06 DIAGNOSIS — N946 Dysmenorrhea, unspecified: Secondary | ICD-10-CM

## 2011-09-06 NOTE — Progress Notes (Signed)
Mary Duffy is an 36 y.o. female. 3 P 2-0-1-2. She has been followed at the [facility] division of [facility]. She has a history of menorrhagia and dysmenorrhea. A hydrosonosonogram showed endometrial polyps.   Pertinent Gynecological History:  Menses: heavy with cramping  Bleeding: heavy  Contraception: none  DES exposure: unknown  Blood transfusions: none  Sexually transmitted diseases: no past history  Previous GYN Procedures: DNC  Last mammogram: not applicable Date: none  Last pap: normal Date: November 2012   Menstrual History:  Patient's last menstrual period was 08/30/2011.  Past Medical History   Diagnosis  Date   .  Anxiety    .  ITP (idiopathic thrombocytopenic purpura)      last cbc 1/13-60,000 platelets   .  Murmur     Past Surgical History   Procedure  Date   .  Cesarean section  x2-2004   .  Splenectomy  2001   .  Right ankle bone spurs  90's   History reviewed. No pertinent family history.  Social History: reports that she quit smoking about 11 years ago. She has never used smokeless tobacco. She reports that she drinks alcohol. She reports that she does not use illicit drugs.  Allergies: No Known Allergies  Review of systems: Normal GI and GU function. History of infertility.   Blood pressure 112/64, temperature 98.9 F (37.2 C), temperature source Oral, height 5' (1.524 m), weight 115 lb (52.164 kg), last menstrual period 08/30/2011.  General: alert, cooperative and no distress  Resp: clear to auscultation bilaterally  Cardio: regular rate and rhythm, S1, S2 normal, no murmur, click, rub or gallop  GI: soft, non-tender; bowel sounds normal; no masses, no organomegaly  Extremities: extremities normal, atraumatic, no cyanosis or edema  Neurologic exam: Grossly normal  External genitalia: normal general appearance  Vaginal: normal without tenderness, induration or masses  Cervix: nontender  Adnexa: normal bimanual exam    Uterus: normal size shape and consistency   Assessment/Plan:  Menorrhagia  Dysmenorrhea  Endometrial polyps  History of idiopathic thrombocytopenia purpura, status post splenectomy  Plan hysteroscopy with resection of polyps. Then a dilatation and curettage. The patient understands the indications for surgical procedure. She understands and accepts the risk of, but not limited to, anesthetic complications, bleeding, infection, and possible damage to the surrounding organs.   Sherrine Salberg V  09/06/2011, 4:02 PM  

## 2011-09-08 ENCOUNTER — Encounter (HOSPITAL_COMMUNITY): Payer: Self-pay | Admitting: Pharmacist

## 2011-09-14 NOTE — H&P (Signed)
Mary Duffy is an 36 y.o. female. 3 P 2-0-1-2. She has been followed at the [facility]. She has a history of menorrhagia and dysmenorrhea. A hydrosonosonogram showed endometrial polyps.   Pertinent Gynecological History:  Menses: heavy with cramping  Bleeding: heavy  Contraception: none  DES exposure: unknown  Blood transfusions: none  Sexually transmitted diseases: no past history  Previous GYN Procedures: DNC  Last mammogram: not applicable Date: none  Last pap: normal Date: November 2012   Menstrual History:  Patient's last menstrual period was 08/30/2011.  Past Medical History   Diagnosis  Date   .  Anxiety    .  ITP (idiopathic thrombocytopenic purpura)      last cbc 1/13-60,000 platelets   .  Murmur     Past Surgical History   Procedure  Date   .  Cesarean section  x2-2004   .  Splenectomy  2001   .  Right ankle bone spurs  90's   History reviewed. No pertinent family history.  Social History: reports that she quit smoking about 11 years ago. She has never used smokeless tobacco. She reports that she drinks alcohol. She reports that she does not use illicit drugs.  Allergies: No Known Allergies  Review of systems: Normal GI and GU function. History of infertility.   Blood pressure 112/64, temperature 98.9 F (37.2 C), temperature source Oral, height 5' (1.524 m), weight 115 lb (52.164 kg), last menstrual period 08/30/2011.  General: alert, cooperative and no distress  Resp: clear to auscultation bilaterally  Cardio: regular rate and rhythm, S1, S2 normal, no murmur, click, rub or gallop  GI: soft, non-tender; bowel sounds normal; no masses, no organomegaly  Extremities: extremities normal, atraumatic, no cyanosis or edema  Neurologic exam: Grossly normal  External genitalia: normal general appearance  Vaginal: normal without tenderness, induration or masses  Cervix: nontender  Adnexa: normal bimanual exam    Uterus: normal size shape and consistency   Assessment/Plan:  Menorrhagia  Dysmenorrhea  Endometrial polyps  History of idiopathic thrombocytopenia purpura, status post splenectomy  Plan hysteroscopy with resection of polyps. Then a dilatation and curettage. The patient understands the indications for surgical procedure. She understands and accepts the risk of, but not limited to, anesthetic complications, bleeding, infection, and possible damage to the surrounding organs.   Janine Limbo  09/06/2011, 4:02 PM

## 2011-09-15 ENCOUNTER — Telehealth: Payer: Self-pay | Admitting: Obstetrics and Gynecology

## 2011-09-15 NOTE — Telephone Encounter (Signed)
Ready for surgery.  Dr. Stefano Gaul

## 2011-09-16 ENCOUNTER — Encounter (HOSPITAL_COMMUNITY)
Admission: RE | Disposition: A | Discharge status: Home / Self Care | Payer: Self-pay | Source: Ambulatory Visit | Attending: Obstetrics and Gynecology

## 2011-09-16 ENCOUNTER — Encounter (HOSPITAL_COMMUNITY): Payer: Self-pay | Admitting: Anesthesiology

## 2011-09-16 ENCOUNTER — Ambulatory Visit (HOSPITAL_COMMUNITY): Payer: BC Managed Care – PPO | Admitting: Anesthesiology

## 2011-09-16 ENCOUNTER — Encounter (HOSPITAL_COMMUNITY): Payer: Self-pay | Admitting: *Deleted

## 2011-09-16 ENCOUNTER — Ambulatory Visit (HOSPITAL_COMMUNITY)
Admission: RE | Admit: 2011-09-16 | Discharge: 2011-09-16 | Disposition: A | Discharge status: Home / Self Care | Payer: BC Managed Care – PPO | Source: Ambulatory Visit | Attending: Obstetrics and Gynecology | Admitting: Obstetrics and Gynecology

## 2011-09-16 ENCOUNTER — Encounter: Payer: Self-pay | Admitting: Obstetrics and Gynecology

## 2011-09-16 DIAGNOSIS — N920 Excessive and frequent menstruation with regular cycle: Secondary | ICD-10-CM | POA: Insufficient documentation

## 2011-09-16 DIAGNOSIS — N946 Dysmenorrhea, unspecified: Secondary | ICD-10-CM | POA: Insufficient documentation

## 2011-09-16 DIAGNOSIS — N926 Irregular menstruation, unspecified: Secondary | ICD-10-CM

## 2011-09-16 DIAGNOSIS — N840 Polyp of corpus uteri: Secondary | ICD-10-CM

## 2011-09-16 HISTORY — DX: Cardiac murmur, unspecified: R01.1

## 2011-09-16 LAB — CBC
HCT: 36.1 % (ref 36.0–46.0)
MCH: 29.5 pg (ref 26.0–34.0)
MCHC: 31.9 g/dL (ref 30.0–36.0)
MCV: 92.6 fL (ref 78.0–100.0)
Platelets: 66 10*3/uL — ABNORMAL LOW (ref 150–400)
RDW: 13.6 % (ref 11.5–15.5)
WBC: 6.1 10*3/uL (ref 4.0–10.5)

## 2011-09-16 SURGERY — DILATATION & CURETTAGE/HYSTEROSCOPY WITH RESECTOCOPE
Anesthesia: General | Site: Uterus | Wound class: Clean

## 2011-09-16 MED ORDER — MIDAZOLAM HCL 2 MG/2ML IJ SOLN: 0.5000 mg | Freq: Once | INTRAMUSCULAR | Status: DC | PRN

## 2011-09-16 MED ORDER — LIDOCAINE HCL (CARDIAC) 20 MG/ML IV SOLN
INTRAVENOUS | Status: AC
  Filled 2011-09-16: qty 5

## 2011-09-16 MED ORDER — BUPIVACAINE-EPINEPHRINE 0.5% -1:200000 IJ SOLN
INTRAMUSCULAR | Status: DC | PRN
  Administered 2011-09-16: 30 mL

## 2011-09-16 MED ORDER — GLYCINE 1.5 % IR SOLN
Status: DC | PRN
  Administered 2011-09-16: 3000 mL

## 2011-09-16 MED ORDER — LACTATED RINGERS IV SOLN
INTRAVENOUS | Status: DC
  Administered 2011-09-16 (×4): via INTRAVENOUS

## 2011-09-16 MED ORDER — MIDAZOLAM HCL 5 MG/5ML IJ SOLN
INTRAMUSCULAR | Status: DC | PRN
  Administered 2011-09-16: 2 mg via INTRAVENOUS

## 2011-09-16 MED ORDER — FENTANYL CITRATE 0.05 MG/ML IJ SOLN
INTRAMUSCULAR | Status: AC
  Filled 2011-09-16: qty 2

## 2011-09-16 MED ORDER — ONDANSETRON HCL 4 MG/2ML IJ SOLN
INTRAMUSCULAR | Status: AC
  Filled 2011-09-16: qty 2

## 2011-09-16 MED ORDER — PROPOFOL 10 MG/ML IV EMUL
INTRAVENOUS | Status: AC
  Filled 2011-09-16: qty 20

## 2011-09-16 MED ORDER — BUPIVACAINE-EPINEPHRINE (PF) 0.5% -1:200000 IJ SOLN
INTRAMUSCULAR | Status: AC
  Filled 2011-09-16: qty 10

## 2011-09-16 MED ORDER — IBUPROFEN 800 MG PO TABS: 800.0000 mg | ORAL_TABLET | Freq: Three times a day (TID) | ORAL | Status: AC | PRN

## 2011-09-16 MED ORDER — KETOROLAC TROMETHAMINE 60 MG/2ML IM SOLN
INTRAMUSCULAR | Status: DC | PRN
  Administered 2011-09-16: 60 mg via INTRAMUSCULAR

## 2011-09-16 MED ORDER — MIDAZOLAM HCL 2 MG/2ML IJ SOLN
INTRAMUSCULAR | Status: AC
  Filled 2011-09-16: qty 2

## 2011-09-16 MED ORDER — ONDANSETRON HCL 4 MG/2ML IJ SOLN
INTRAMUSCULAR | Status: DC | PRN
  Administered 2011-09-16: 4 mg via INTRAVENOUS

## 2011-09-16 MED ORDER — PROPOFOL 10 MG/ML IV BOLUS
INTRAVENOUS | Status: DC | PRN
  Administered 2011-09-16: 170 mg via INTRAVENOUS

## 2011-09-16 MED ORDER — DEXAMETHASONE SODIUM PHOSPHATE 10 MG/ML IJ SOLN
INTRAMUSCULAR | Status: AC
  Filled 2011-09-16: qty 1

## 2011-09-16 MED ORDER — MEPERIDINE HCL 25 MG/ML IJ SOLN: 6.2500 mg | INTRAMUSCULAR | Status: DC | PRN

## 2011-09-16 MED ORDER — FENTANYL CITRATE 0.05 MG/ML IJ SOLN
INTRAMUSCULAR | Status: DC | PRN
  Administered 2011-09-16 (×4): 50 ug via INTRAVENOUS

## 2011-09-16 MED ORDER — OXYCODONE-ACETAMINOPHEN 5-325 MG PO TABS: 1.0000 | ORAL_TABLET | ORAL | Status: AC | PRN

## 2011-09-16 MED ORDER — FENTANYL CITRATE 0.05 MG/ML IJ SOLN: 25.0000 ug | INTRAMUSCULAR | Status: DC | PRN

## 2011-09-16 MED ORDER — PROMETHAZINE HCL 25 MG/ML IJ SOLN: 6.2500 mg | INTRAMUSCULAR | Status: DC | PRN

## 2011-09-16 MED ORDER — DEXAMETHASONE SODIUM PHOSPHATE 4 MG/ML IJ SOLN
INTRAMUSCULAR | Status: DC | PRN
  Administered 2011-09-16: 4 mg via INTRAVENOUS

## 2011-09-16 MED ORDER — FENTANYL CITRATE 0.05 MG/ML IJ SOLN
INTRAMUSCULAR | Status: AC
  Filled 2011-09-16: qty 4

## 2011-09-16 MED ORDER — LIDOCAINE HCL (CARDIAC) 20 MG/ML IV SOLN
INTRAVENOUS | Status: DC | PRN
  Administered 2011-09-16: 30 mg via INTRAVENOUS

## 2011-09-16 MED ORDER — PROMETHAZINE HCL 12.5 MG PO TABS: 12.5000 mg | ORAL_TABLET | Freq: Four times a day (QID) | ORAL | Status: DC | PRN

## 2011-09-16 MED ORDER — KETOROLAC TROMETHAMINE 60 MG/2ML IM SOLN
INTRAMUSCULAR | Status: AC
  Filled 2011-09-16: qty 2

## 2011-09-16 SURGICAL SUPPLY — 15 items
CANISTER SUCTION 2500CC (MISCELLANEOUS) ×2 IMPLANT
CATH ROBINSON RED A/P 16FR (CATHETERS) ×2 IMPLANT
CLOTH BEACON ORANGE TIMEOUT ST (SAFETY) ×2 IMPLANT
CONTAINER PREFILL 10% NBF 60ML (FORM) ×4 IMPLANT
ELECT REM PT RETURN 9FT ADLT (ELECTROSURGICAL)
ELECTRODE REM PT RTRN 9FT ADLT (ELECTROSURGICAL) IMPLANT
GLOVE BIOGEL PI IND STRL 8.5 (GLOVE) ×1 IMPLANT
GLOVE BIOGEL PI INDICATOR 8.5 (GLOVE) ×1
GLOVE ECLIPSE 8.0 STRL XLNG CF (GLOVE) ×4 IMPLANT
GOWN PREVENTION PLUS LG XLONG (DISPOSABLE) ×2 IMPLANT
GOWN STRL REIN XL XLG (GOWN DISPOSABLE) ×2 IMPLANT
LOOP ANGLED CUTTING 22FR (CUTTING LOOP) IMPLANT
PACK HYSTEROSCOPY LF (CUSTOM PROCEDURE TRAY) ×2 IMPLANT
TOWEL OR 17X24 6PK STRL BLUE (TOWEL DISPOSABLE) ×4 IMPLANT
WATER STERILE IRR 1000ML POUR (IV SOLUTION) ×2 IMPLANT

## 2011-09-16 NOTE — Transfer of Care (Signed)
Immediate Anesthesia Transfer of Care Note  Patient: Mary Duffy  Procedure(s) Performed: Procedure(s) (LRB): DILATATION & CURETTAGE/HYSTEROSCOPY WITH RESECTOCOPE (N/A)  Patient Location: PACU  Anesthesia Type: General  Level of Consciousness: awake, alert , oriented and patient cooperative  Airway & Oxygen Therapy: Patient Spontanous Breathing and Patient connected to nasal cannula oxygen  Post-op Assessment: Report given to PACU RN and Post -op Vital signs reviewed and stable  Post vital signs: Reviewed and stable  Complications: No apparent anesthesia complications

## 2011-09-16 NOTE — H&P (Signed)
The patient was interviewed and examined today.  The previously documented history and physical examination was reviewed. There are no changes. The operative procedure was reviewed. The risks and benefits were outlined again. The specific risks include, but are not limited to, anesthetic complications, bleeding, infections, and possible damage to the surrounding organs. The patient's questions were answered.  We are ready to proceed as outlined. The likelihood of the patient achieving the goals of this procedure is very likely.   Mary Duffy Vernon [name], M.D.  

## 2011-09-16 NOTE — Op Note (Signed)
OPERATIVE NOTE  TRACE CEDERBERG  DOB:    05-28-1975  MRN:    [id number]  CSN:    [id number]  Date of Surgery:  09/16/2011  Preoperative Diagnosis:  Irregular bleeding Endometrial polyps  Postoperative Diagnosis:  Irregular bleeding Endometrial polyps  Procedure:  Hysteroscopy Dilatation and curettage  Surgeon:  Leonard Schwartz, M.D.  Assistant:  None  Anesthetic:  General  Disposition:  The patient is a 36 y.o.-year-old female who presents with irregular bleeding and endometrial polyps. She understands the indications for her surgical procedure. She accepts the risk of, but not limited to, anesthetic complications, bleeding, infections, and possible damage to the surrounding organs.  Findings:  On examination under anesthesia the uterus was normal size. No adnexal masses were appreciated. No parametrial disease was appreciated. The uterus sounded to 8 cm. The patient was noted to have 2 endometrial polyps with the largest polyp measuring less than 0.5 cm.  Procedure:  The patient was taken to the operating room where a general anesthetic was given. The perineum and vagina were prepped with Betadine. The bladder was drained of urine. The patient was sterilely draped. Examination under anesthesia was performed. A paracervical block was placed using 10 cc of half percent Marcaine with epinephrine. An endocervical curettage was performed. The cervix was gently dilated. The diagnostic hysteroscope was inserted and the cavity was carefully inspected. Pictures were taken. Findings included: To endometrial polyps. No other pathology was appreciated. The diagnostic hysteroscope was removed.  The cavity was then curetted using a sharp curet. The cavity was felt to be clean at the end of our procedure. The cavity was inspected once again with the hysteroscope and the cavity was thought to be clean. Hemostasis was adequate. All instruments were removed. The examination was  repeated and the uterus was noted to be firm. Sponge, and needle counts were correct. The estimated blood loss for the procedure was 10 cc. The estimated fluid deficit loss 460 cc but 400 cc of fluid was noted on the operating room floor . The patient was awakened from her anesthetic without difficulty. She was returned to the supine position and and transported to the recovery room in stable condition. The endocervical curettings, and endometrial curettings were sent to pathology.  Followup instructions:  The patient will return to see Dr. Stefano Gaul in 2 weeks. She was given a copy of the postoperative instructions for patients who've undergone hysteroscopy.  Discharge medications:  Motrin 800 mg every 8 hours as needed for mild to moderate pain. Percocet one tablet every 4 hours as needed for severe pain. Phenergan 12.5 mg every 6 hours as needed for nausea.  Leonard Schwartz, M.D.

## 2011-09-16 NOTE — Anesthesia Preprocedure Evaluation (Signed)
Anesthesia Evaluation  Patient identified by MRN, date of birth, ID band Patient awake    Reviewed: Allergy & Precautions, H&P , Patient's Chart, lab work & pertinent test results, reviewed documented beta blocker date and time   History of Anesthesia Complications Negative for: history of anesthetic complications  Airway Mallampati: II TM Distance: >3 FB Neck ROM: full    Dental No notable dental hx.    Pulmonary neg pulmonary ROS,  breath sounds clear to auscultation  Pulmonary exam normal       Cardiovascular Exercise Tolerance: Good negative cardio ROS  + Valvular Problems/Murmurs Rhythm:regular Rate:Normal     Neuro/Psych negative neurological ROS  negative psych ROS   GI/Hepatic negative GI ROS, Neg liver ROS,   Endo/Other  negative endocrine ROS  Renal/GU negative Renal ROS     Musculoskeletal   Abdominal   Peds  Hematology negative hematology ROS (+)   Anesthesia Other Findings Anxiety     ITP (idiopathic thrombocytopenic purpura)   last cbc 1/13-60,000 platelets    Murmur          Reproductive/Obstetrics negative OB ROS                           Anesthesia Physical Anesthesia Plan  ASA: II  Anesthesia Plan: General LMA   Post-op Pain Management:    Induction:   Airway Management Planned:   Additional Equipment:   Intra-op Plan:   Post-operative Plan:   Informed Consent: I have reviewed the patients History and Physical, chart, labs and discussed the procedure including the risks, benefits and alternatives for the proposed anesthesia with the patient or authorized representative who has indicated his/her understanding and acceptance.   Dental Advisory Given  Plan Discussed with: CRNA, Surgeon and Anesthesiologist  Anesthesia Plan Comments:         Anesthesia Quick Evaluation

## 2011-09-16 NOTE — Preoperative (Signed)
Beta Blockers   Reason not to administer Beta Blockers:Not Applicable 

## 2011-09-16 NOTE — Anesthesia Postprocedure Evaluation (Signed)
Anesthesia Post Note  Patient: Mary Duffy  Procedure(s) Performed: Procedure(s) (LRB): DILATATION & CURETTAGE/HYSTEROSCOPY WITH RESECTOCOPE (N/A)  Anesthesia type: General  Patient location: PACU  Post pain: Pain level controlled  Post assessment: Post-op Vital signs reviewed  Last Vitals:  Filed Vitals:   09/16/11 1200  BP: 111/63  Pulse: 90  Temp: 37 C  Resp: 24    Post vital signs: Reviewed  Level of consciousness: sedated  Complications: No apparent anesthesia complications

## 2011-09-21 ENCOUNTER — Telehealth: Payer: Self-pay | Admitting: Obstetrics and Gynecology

## 2011-09-21 NOTE — Telephone Encounter (Signed)
See telephone call. AVS 

## 2011-09-23 ENCOUNTER — Encounter: Payer: Self-pay | Admitting: Obstetrics and Gynecology

## 2011-09-27 ENCOUNTER — Ambulatory Visit (INDEPENDENT_AMBULATORY_CARE_PROVIDER_SITE_OTHER): Payer: BC Managed Care – PPO | Admitting: Obstetrics and Gynecology

## 2011-09-27 ENCOUNTER — Encounter: Payer: Self-pay | Admitting: Obstetrics and Gynecology

## 2011-09-27 VITALS — BP 106/72 | Wt 118.0 lb

## 2011-09-27 DIAGNOSIS — D693 Immune thrombocytopenic purpura: Secondary | ICD-10-CM | POA: Insufficient documentation

## 2011-09-27 DIAGNOSIS — D696 Thrombocytopenia, unspecified: Secondary | ICD-10-CM

## 2011-09-27 DIAGNOSIS — N926 Irregular menstruation, unspecified: Secondary | ICD-10-CM

## 2011-09-27 NOTE — Progress Notes (Signed)
DATE OF SURGERY: 09/16/2011 TYPE OF SURGERY Hysteroscopy D and C  PAIN:Yes Back  VAG BLEEDING: yes VAG DISCHARGE: no  NORMAL GI FUNCTN: yes NORMAL GU FUNCTN: no  HISTORY OF PRESENT ILLNESS  Ms. Mary Duffy is a 36 y.o. year old female,[id number], who presents for a postop visit. Her path report was benign. Her platelet count was 66,000.  The patient has a history of thrombocytopenia.  Her most recent platelet count was 80,000.  She is followed by her family physician.  Subjective:  She complains of lower back pain.  Objective:  BP 106/72  Wt 118 lb (53.524 kg)  LMP 08/30/2011   General: alert and no distress GI: soft and nontender back: No CVA tenderness, no masses  External genitalia: normal general appearance Vaginal: normal without tenderness, induration or masses Adnexa: normal bimanual exam Uterus: normal size shape and consistency  Assessment:  Menorrhagia and dysmenorrhea  Doing well status post hysteroscopy with D&C  Thrombocytopenia  Plan:  The patient will return to normal activities.  The patient was to get pregnant.  She declines referral to a reproductive endocrinologist.  The patient's platelet count will be followed by her primary physician  Return to office in 3 month(s) for her annual exam.   Leonard Schwartz M.D.  09/27/2011 4:29 PM

## 2011-11-01 ENCOUNTER — Telehealth: Payer: Self-pay | Admitting: Obstetrics and Gynecology

## 2011-11-01 NOTE — Telephone Encounter (Signed)
avs pt 

## 2011-11-01 NOTE — Telephone Encounter (Signed)
Chandra/AR PT

## 2011-11-01 NOTE — Telephone Encounter (Signed)
TC to pt. States has not had menses since procedure 09/16/11. Has had cramping and PMS symptoms x 4 weeks.  Menses are sometimes q 5-5 1/2 wks. No contraception. Per EP offered Progesterone to initiate menses or will consult with Dr AVS when returns to office 11/07/11.  Pt opts to wait. Will call if menses starts.

## 2011-11-09 NOTE — Telephone Encounter (Signed)
TC to pt. LM to return call for information.

## 2011-11-09 NOTE — Telephone Encounter (Signed)
The patient has a history of irregular cycles.  Have her see me in October if no menses after 3 months.  Okay to provide Ultram 50 mg; one tablet every 4 hours as needed for pain.  #30.  One refill.  Dr. Stefano Gaul

## 2011-11-10 NOTE — Telephone Encounter (Signed)
TC to pt. Per DR AVS, advised if does not have menses after 3 months,  To schedule appt in October. Pt verbalizes comprehension. Pt declines pain RX.

## 2012-02-29 ENCOUNTER — Other Ambulatory Visit: Payer: Self-pay | Admitting: Internal Medicine

## 2012-02-29 DIAGNOSIS — M549 Dorsalgia, unspecified: Secondary | ICD-10-CM

## 2012-03-05 ENCOUNTER — Ambulatory Visit
Admission: RE | Admit: 2012-03-05 | Discharge: 2012-03-05 | Disposition: A | Discharge status: Home / Self Care | Payer: BC Managed Care – PPO | Source: Ambulatory Visit | Attending: Internal Medicine | Admitting: Internal Medicine

## 2012-03-05 DIAGNOSIS — M549 Dorsalgia, unspecified: Secondary | ICD-10-CM

## 2012-11-02 ENCOUNTER — Encounter: Payer: Self-pay | Admitting: Obstetrics & Gynecology

## 2012-11-02 ENCOUNTER — Ambulatory Visit (INDEPENDENT_AMBULATORY_CARE_PROVIDER_SITE_OTHER): Payer: 59 | Admitting: Obstetrics & Gynecology

## 2012-11-02 VITALS — BP 110/70 | HR 64 | Resp 16 | Ht 61.75 in | Wt 115.2 lb

## 2012-11-02 DIAGNOSIS — N920 Excessive and frequent menstruation with regular cycle: Secondary | ICD-10-CM

## 2012-11-02 DIAGNOSIS — Z0000 Encounter for general adult medical examination without abnormal findings: Secondary | ICD-10-CM

## 2012-11-02 DIAGNOSIS — Z124 Encounter for screening for malignant neoplasm of cervix: Secondary | ICD-10-CM

## 2012-11-02 DIAGNOSIS — D649 Anemia, unspecified: Secondary | ICD-10-CM

## 2012-11-02 DIAGNOSIS — Z01419 Encounter for gynecological examination (general) (routine) without abnormal findings: Secondary | ICD-10-CM

## 2012-11-02 LAB — POCT URINALYSIS DIPSTICK
Glucose, UA: NEGATIVE
Nitrite, UA: NEGATIVE
Protein, UA: NEGATIVE
Urobilinogen, UA: NEGATIVE
pH, UA: 7

## 2012-11-02 LAB — IRON: Iron: 97 ug/dL (ref 42–145)

## 2012-11-02 NOTE — Progress Notes (Addendum)
37 y.o. Mary Duffy Legally SeparatedCaucasianF here for annual exam.  Cycles are regular, flow lasts 5 days.  Heavy for 2 days.  When flow is heavy, changes every 2 hours.  Soaks through at night.  Passes clots and significant cramping.  H/o polyp removal about a year ago.  Had HSG.  No report in system.  Pathology reviewed with pt.  Patient's last menstrual period was 10/28/2012.          Sexually active: yes  The current method of family planning is none.    Exercising: yes  jogging, hiking, and eliptical Smoker:  no  Health Maintenance: Pap:  Over one year History of abnormal Pap:  yes MMG:  11/06, breast pain Colonoscopy:  none BMD:   none TDaP:  2012 Screening Labs: Dr. Ricki Tylan Briguglio sees pt yearly, Hb today: 10.9, Urine today: WBC-trace, RBC-trace, PH-7.0   reports that she quit smoking about 12 years ago. She has never used smokeless tobacco. She reports that she drinks about 2.5 ounces of alcohol per week. She reports that she does not use illicit drugs.  Past Medical History  Diagnosis Date  . Anxiety   . ITP (idiopathic thrombocytopenic purpura)     last cbc 1/13-60,000 platelets  . Murmur   . Abnormal Pap smear 1999    colpo 1999  . Anemia   . Cystic fibrosis carrier   . H/O infertility   . IBS (irritable bowel syndrome)   . Fever blister     Past Surgical History  Procedure Laterality Date  . Cesarean section  x2-2004  . Splenectomy  2001  . Right ankle bone spurs  90's    Current Outpatient Prescriptions  Medication Sig Dispense Refill  . acyclovir (ZOVIRAX) 400 MG tablet as needed.       Marland Kitchen esomeprazole (NEXIUM) 20 MG capsule Take 20 mg by mouth daily before breakfast.      . meloxicam (MOBIC) 15 MG tablet       . Multiple Vitamin (MULTIVITAMIN WITH MINERALS) TABS Take 1 tablet by mouth daily. Uses [employer] brand.      . sertraline (ZOLOFT) 25 MG tablet Take 25 mg by mouth daily.       No current facility-administered medications for this visit.    Family History   Problem Relation Age of Onset  . Heart disease Maternal Grandmother   . Hyperlipidemia Maternal Grandmother   . Breast cancer Maternal Grandmother 49  . Cancer Paternal Grandmother     lung  . Hypothyroidism Mother   . Hyperlipidemia Mother   . Hypertension Mother   . Migraines Mother   . Heart disease Father     ROS:  Pertinent items are noted in HPI.  Otherwise, a comprehensive ROS was negative.  Exam:   BP 110/70  Pulse 64  Resp 16  Ht 5' 1.75" (1.568 m)  Wt 115 lb 3.2 oz (52.254 kg)  BMI 21.25 kg/m2  LMP 10/28/2012   Height: 5' 1.75" (156.8 cm)  Ht Readings from Last 3 Encounters:  11/02/12 5' 1.75" (1.568 m)  09/06/11 5' (1.524 m)    General appearance: alert, cooperative and appears stated age Head: Normocephalic, without obvious abnormality, atraumatic Neck: no adenopathy, supple, symmetrical, trachea midline and thyroid normal to inspection and palpation Lungs: clear to auscultation bilaterally Breasts: normal appearance, no masses or tenderness Heart: regular rate and rhythm Abdomen: soft, non-tender; bowel sounds normal; no masses,  no organomegaly Extremities: extremities normal, atraumatic, no cyanosis or edema Skin: Skin color, texture,  turgor normal. No rashes or lesions Lymph nodes: Cervical, supraclavicular, and axillary nodes normal. No abnormal inguinal nodes palpated Neurologic: Grossly normal   Pelvic: External genitalia:  no lesions              Urethra:  normal appearing urethra with no masses, tenderness or lesions              Bartholins and Skenes: normal                 Vagina: normal appearing vagina with normal color and discharge, no lesions              Cervix: no lesions              Pap taken: yes Bimanual Exam:  Uterus:  normal size, contour, position, consistency, mobility, non-tender and retroverted              Adnexa: normal adnexa and no mass, fullness, tenderness               Rectovaginal: Confirms               Anus:   normal sphincter tone, no lesions  A:  Well Woman with normal exam Menorrhagia H/O Cesarean section x 2 H/O polyp resection with D&C  P:   Mammogram starting yearly after 38th birthday Breast cancer in MGM CBC and iron studies pap smear--ASCUS with reflex HR HPV ordered. Information on ablation, hysterectomy, IUD, and OCPs given Will try to get records from [facility] about ultrasound last year and last pap smear. return annually or prn  An After Visit Summary was printed and given to the patient.   Records came 11/06/12.  No recent u/s report.  Last Pap 2012.  Will order [facility] and add HR HPV to PAP.  MSM.

## 2012-11-02 NOTE — Patient Instructions (Signed)

## 2012-11-03 LAB — CBC
MCHC: 32.9 g/dL (ref 30.0–36.0)
Platelets: 50 10*3/uL — ABNORMAL LOW (ref 150–400)
RDW: 13.7 % (ref 11.5–15.5)
WBC: 5.5 10*3/uL (ref 4.0–10.5)

## 2012-11-05 LAB — IPS PAP TEST WITH REFLEX TO HPV

## 2012-11-06 ENCOUNTER — Telehealth: Payer: Self-pay

## 2012-11-06 NOTE — Telephone Encounter (Signed)
lmtcb

## 2012-11-06 NOTE — Telephone Encounter (Signed)
Message copied by Elisha Headland on Tue Nov 06, 2012 10:50 AM ------      Message from: Jerene Bears      Created: Mon Nov 05, 2012 10:09 AM       Inform hb is a little low 11.0.  Should start a Vitamin with some iron. ------

## 2012-11-07 ENCOUNTER — Telehealth: Payer: Self-pay | Admitting: *Deleted

## 2012-11-07 NOTE — Addendum Note (Signed)
Addended by: Jerene Bears on: 11/07/2012 02:59 PM   Modules accepted: Orders

## 2012-11-07 NOTE — Telephone Encounter (Signed)
Call to patient to schedule SHGM. LMTCB. LM possible appt available tomorrow. LMTCB.

## 2012-11-08 ENCOUNTER — Ambulatory Visit (INDEPENDENT_AMBULATORY_CARE_PROVIDER_SITE_OTHER): Payer: 59

## 2012-11-08 ENCOUNTER — Other Ambulatory Visit: Payer: Self-pay | Admitting: Obstetrics & Gynecology

## 2012-11-08 ENCOUNTER — Ambulatory Visit (INDEPENDENT_AMBULATORY_CARE_PROVIDER_SITE_OTHER): Payer: 59 | Admitting: Obstetrics & Gynecology

## 2012-11-08 VITALS — BP 102/60 | Ht 61.75 in | Wt 118.2 lb

## 2012-11-08 DIAGNOSIS — D693 Immune thrombocytopenic purpura: Secondary | ICD-10-CM

## 2012-11-08 DIAGNOSIS — N920 Excessive and frequent menstruation with regular cycle: Secondary | ICD-10-CM

## 2012-11-08 NOTE — Progress Notes (Signed)
37 y.o.Legally Separatedfemale here for a pelvic ultrasound with sonohystogram to evaluation for heavy menstrual cycles.  Patient and I discussed options at her first OV and she is most interested in endometrial ablation.  She did have some ultrasound testing last year before hysteroscopy and D&C.  That did not really help the bleeding for longer than a month.  I have been unable to obtain these records so patient is here for evaluation.  Also, she is aware last Pap done at prior gyn office was 2012.  I added HR HPV testing to her Pap.  Initial Pap results are negative but HPV results not back yet.  Indication: menorrhagia  Patient's last menstrual period was 10/28/2012.  Contraception: none  Technique:  Both transabdominal and transvaginal ultrasound examinations of the pelvis were performed. Transabdominal technique was performed for global imaging of the pelvis including uterus, ovaries, adnexal regions, and pelvic cul-de-sac. It was necessary to proceed with endovaginal exam following the abdominal ultrasound transabdominal exam to visualize the endometrium and adnexa. Color and duplex Doppler ultrasound was utilized to evaluate blood flow to the ovaries.    FINDINGS: UTERUS: 7.6 x 4.4 x 3.8cm with significant cesarean section scar/defect in lower uterine segment EMS: 5.41mm ADNEXA:  Left 2.9 x 2.0 x 1.4cm  Right ovary 2.8 x  2.5 x 1.9cm with 1.6cm simple cyst CUL DE SAC: no free fluid  SHSG:  After obtaining appropriate verbal consent from patient, the cervix was visualized using a speculum, and prepped with betadine.  A tenaculum  was applied to the cervix.  Dilation of the cervix was not necessary. The catheter was passed into the uterus and sterile saline introduced, with the following findings: no defects, thin endometrium, thinnest area at cesarean section scar with 6mm.  Endometrial biopsy recommended.  Discussed with patient.  Verbal and written consent obtained.   Procedure:   Speculum placed.  Cervix visualized and cleansed with betadine prep.  A single toothed tenaculum was applied to the anterior lip of the cervix.  Endometrial pipelle was advanced through the cervix into the endometrial cavity without difficulty.  Pipelle passed to 7.0cm.  Suction applied and pipelle removed with good tissue sample obtained.  Tenculum removed.  No bleeding noted.  Patient tolerated procedure well.  Images reviewed with patient.  D/W pt my discomfort in proceeding with ablation given thin nature of lower uterine segment findings from prior cesarean.  She is not sure want to be on OCPs or use an IUD.  H/O ITP so if hysterectomy is what she wants I think we need to get hematology involved.  Will refer to Dr. Myna Hidalgo.  Pt in agreement.  Assessment:  Menorrhagia, ITP  Plan:  Consider Mirena IUD vs hysterectomy.  Pt has not current BC but either would provide for this need/eliminate this need. Refer to Dr. Myna Hidalgo to see if anything else can be done for ITP.  Last platelet count was 50,000 Endometrial biopsy pending  ~30 minutes spent with patient >50% of time was in face to face discussion of above.

## 2012-11-09 NOTE — Telephone Encounter (Signed)
Patient notified of all results. 

## 2012-11-10 ENCOUNTER — Other Ambulatory Visit: Payer: Self-pay | Admitting: Obstetrics & Gynecology

## 2012-11-10 DIAGNOSIS — Z124 Encounter for screening for malignant neoplasm of cervix: Secondary | ICD-10-CM

## 2012-11-11 ENCOUNTER — Encounter: Payer: Self-pay | Admitting: Obstetrics & Gynecology

## 2012-11-11 NOTE — Patient Instructions (Signed)
We will let you know about referral appointment

## 2012-11-12 ENCOUNTER — Encounter: Payer: Self-pay | Admitting: Obstetrics & Gynecology

## 2012-11-14 NOTE — Telephone Encounter (Signed)
See next message

## 2012-11-23 ENCOUNTER — Encounter: Payer: Self-pay | Admitting: Obstetrics & Gynecology

## 2012-11-28 ENCOUNTER — Telehealth: Payer: Self-pay | Admitting: Obstetrics & Gynecology

## 2012-11-28 ENCOUNTER — Other Ambulatory Visit: Payer: Self-pay | Admitting: Gynecology

## 2012-11-28 DIAGNOSIS — Z309 Encounter for contraceptive management, unspecified: Secondary | ICD-10-CM

## 2012-11-28 MED ORDER — MISOPROSTOL 200 MCG PO TABS: ORAL_TABLET | ORAL | Status: DC

## 2012-11-28 NOTE — Telephone Encounter (Signed)
LMTCB to schedule IUD insertion °

## 2012-11-28 NOTE — Telephone Encounter (Signed)
Patient is calling to schedule her IUD insertion. She said miller told her to call one her first day of her cycle.

## 2012-11-29 ENCOUNTER — Ambulatory Visit (INDEPENDENT_AMBULATORY_CARE_PROVIDER_SITE_OTHER): Payer: 59 | Admitting: Obstetrics & Gynecology

## 2012-11-29 VITALS — BP 110/70 | HR 68 | Resp 16 | Ht 61.75 in | Wt 118.6 lb

## 2012-11-29 DIAGNOSIS — N920 Excessive and frequent menstruation with regular cycle: Secondary | ICD-10-CM

## 2012-11-29 DIAGNOSIS — Z30430 Encounter for insertion of intrauterine contraceptive device: Secondary | ICD-10-CM

## 2012-11-29 NOTE — Patient Instructions (Signed)

## 2012-11-29 NOTE — Progress Notes (Signed)
37 y.o. [id number] Legally Separated Caucasian female presents for insertion of Mirena. She has been counseled about alternative forms of birth control including oral contraceptives, progesterone methods, IUD, barrier method, and sterilization.  She has decided to proceed with IUD placement.  Currently, she denies any vaginal symptoms or STD concerns.  H/O ITP.  Has appt with Dr. Myna Hidalgo scheduled.  LMP:  Patient's last menstrual period was 11/28/2012.  Healthy female,time and place Abdomen: soft, non-tender [place] inguinal nodes palpated  Pelvic exam: Vulva;normal Vagina:normal vagina Cervix:Non-tender, Negative CMT, no lesions or redness Uterus:normal shape, position and consistency   After patient read information booklet and all questions were answered, informed consent was obtained.    Procedure:  Speculum inserted into vagina. Cervix visualized and cleansed with betadine solution X 3. Paracervical block was placed.  1% Lidocaine was used.  10 cc total.  Lot #: [id number].  Exp: 04/21/13.  Tenaculum placed on cervix at 12 o'clock position.  Uterus sounded to 7 centimeters.  IUD removed from sterile packet and under sterile conditions inserted to fundus of uterus.  Introducer removed without difficulty.  IUD string trimmed to 2 centimeters.  Remainder string given to patient to feel for identification.  Tenaculum removed.  Some bleeding noted from tenaculum site but made hemostatic with silver nitrate.  Speculum removed.  Uterus palpated normal.  Patient tolerated procedure well.  IUD Lot # [id number]  Exp: 8/16.  Package information attached to consent and scanned into EPIC.  A: Insertion of Mirena   P: Return to office 6 weeks for recheck Pt knows IUD needs to be replaced approximately 5 yrs Instructions provided.

## 2012-11-30 NOTE — Telephone Encounter (Signed)
Patient had IUD placed 10/9

## 2012-12-05 ENCOUNTER — Encounter: Payer: Self-pay | Admitting: Obstetrics & Gynecology

## 2012-12-12 ENCOUNTER — Telehealth: Payer: Self-pay | Admitting: Hematology & Oncology

## 2012-12-12 NOTE — Telephone Encounter (Signed)
Left vm w NEW PATIENT today to remind them of their appointment with Dr. Ennever. Also, advised them to bring all meds and insurance information. ° °

## 2012-12-17 ENCOUNTER — Ambulatory Visit: Payer: 59

## 2012-12-17 ENCOUNTER — Other Ambulatory Visit (HOSPITAL_BASED_OUTPATIENT_CLINIC_OR_DEPARTMENT_OTHER): Payer: 59 | Admitting: Lab

## 2012-12-17 ENCOUNTER — Other Ambulatory Visit: Payer: Self-pay | Admitting: *Deleted

## 2012-12-17 ENCOUNTER — Ambulatory Visit (HOSPITAL_BASED_OUTPATIENT_CLINIC_OR_DEPARTMENT_OTHER): Payer: 59 | Admitting: Hematology & Oncology

## 2012-12-17 VITALS — BP 114/68 | HR 61 | Temp 98.3°F | Resp 14 | Ht 61.0 in | Wt 119.0 lb

## 2012-12-17 DIAGNOSIS — D693 Immune thrombocytopenic purpura: Secondary | ICD-10-CM

## 2012-12-17 DIAGNOSIS — N920 Excessive and frequent menstruation with regular cycle: Secondary | ICD-10-CM

## 2012-12-17 DIAGNOSIS — Z9081 Acquired absence of spleen: Secondary | ICD-10-CM

## 2012-12-17 LAB — CBC WITH DIFFERENTIAL (CANCER CENTER ONLY)
BASO%: 0.9 % (ref 0.0–2.0)
Eosinophils Absolute: 0.3 10*3/uL (ref 0.0–0.5)
LYMPH#: 1.8 10*3/uL (ref 0.9–3.3)
LYMPH%: 22.9 % (ref 14.0–48.0)
MONO#: 0.7 10*3/uL (ref 0.1–0.9)
NEUT#: 5.2 10*3/uL (ref 1.5–6.5)
Platelets: 105 10*3/uL — ABNORMAL LOW (ref 145–400)
RBC: 3.81 10*6/uL (ref 3.70–5.32)
RDW: 13.2 % (ref 11.1–15.7)
WBC: 8 10*3/uL (ref 3.9–10.0)

## 2012-12-17 NOTE — Progress Notes (Signed)
This office note has been dictated.

## 2012-12-18 NOTE — Progress Notes (Signed)
CC:   Lum Keas, MD  DIAGNOSIS:  Immune thrombocytopenia.  HISTORY OF PRESENT ILLNESS:  Mary Duffy is a very charming 37 year old white female.  She actually has had a very long history of immune thrombocytopenia.  She says she was found to have this when she was 37 years old.  She had her spleen taken out probably I think about 13 years ago.  She had been on steroids previously.  She has been on IVIG.  She apparently had a reaction to IVIG.  She sees Dr. Leda Quail of OB/GYN.  Ms. Dolle has been having some trouble with her monthly cycles.  Dr. Hyacinth Meeker went ahead and placed a Mirena IUD.  Ms. Carducci has not had a monthly cycle since the IUD was placed.  Ms. Janosik had some lab work done by Dr. Hyacinth Meeker.  A CBC was done which showed a white cell count of 5.5, hemoglobin 11, hematocrit 33.4, platelet count of 50,000.  This was done about 6 weeks ago.  Dr. Hyacinth Meeker felt that a hematologic evaluation was indicated, as Ms. Bloodworth had not seen a hematologist for about 10 years.  Ms. Burback feels well.  She actually works pretty close to where our office is.  Outside of the heavy cycles, she has not had any problems with bleeding otherwise.  She may bruise on occasion. She takes Motrin rarely.  She does not take aspirin products.  She is not a vegetarian.  She has had a good diet.  She has not noticed any change in bowel or bladder.  There have been no rashes.  She has had no leg swelling.  There has been no cough or shortness breath.  She has had no nausea or vomiting.  There have been no issues with dysphagia or odynophagia.  Her main problem has been the heavy cycles.  PAST MEDICAL HISTORY:  Remarkable for: 1. Immune-based thrombocytopenia. 2. Anxiety. 3. Irritable bowel syndrome. 4. Situational depression.  ALLERGIES:  None.  MEDICATIONS:  Acyclovir 400 mg p.o. as needed, Nexium 20 mg p.o. q.h.s., Zoloft 25 mg p.o. daily.  SOCIAL HISTORY:  Negative for tobacco use.   She has rare alcohol use. She does not take herbal supplements.  She says she does take a multivitamin with iron.  FAMILY HISTORY:  Remarkable for a brother who had immune thrombocytopenia.  There is also history of breast cancer in paternal grandmother.  Paternal grandmother had lung cancer.  REVIEW OF SYSTEMS:  As stated in history of present illness.  No additional findings noted on a 12-system review.  PHYSICAL EXAMINATION:  General:  This is a well-developed, well- nourished white female in no obvious distress.  Vital signs: Temperature of 98.3, pulse 80, respiratory rate 18, blood pressure 114/68.  Weight is 119 pounds.  Head and neck:  Normocephalic, atraumatic skull.  There are no ocular or oral lesions.  There are no palpable cervical or supraclavicular lymph nodes.  Lungs:  Clear to percussion and auscultation bilaterally.  Cardiac:  Regular rate and rhythm with a normal S1 and S2.  There are no murmurs, rubs, or bruits. Abdomen:  Soft.  She has a well-healed splenectomy scar in the left upper quadrant.  There is no fluid wave.  There is no abdominal mass. There is no palpable hepatomegaly.  Back:  No tenderness over the spine, ribs, or hips.  Extremities:  Show no clubbing, cyanosis, or edema.  She has good range motion of her joints.  She has good muscle strength in  her upper and lower extremities.  Skin:  No rashes, ecchymoses, or petechia.  Neurological:  Shows no focal neurological deficits.  LABORATORY STUDIES:  White cell count is 8, hemoglobin 11.6, hematocrit 36.5, platelet count 105.  MCV is 96.  Peripheral smear shows a normochromic, normocytic population of red blood cells.  I see no nucleated red blood cells.  There are no teardrop cells.  There are no schistocytes or spherocytes.  I see no rouleaux formation.  White cells appear normal in morphology and maturation. There are no immature myeloid or lymphoid cells.  There are no atypical lymphocytes.  There  are no blasts.  Platelets are slightly decreased in number.  She has large platelets.  Platelets are well granulated.  IMPRESSION:  Mary Duffy is a very charming 37 year old white female with chronic immune thrombocytopenia.  She underwent a splenectomy.  Her platelet count is actually holding pretty steady.  Her blood smear looked relatively benign, although I was a little concerned about not seeing Howell-Jolly bodies.  These typically are seen with splenectomized patients.  When Howell-Jolly bodies disappear on a blood smear from a splenectomized patient, this could signify the possibility of an accessory spleen being developed.  I do not think we have to explore that possibility right now.  I really think Ms. Augello does not need any kind of routine followup from [place].  I told her that we would be more than happy to see her back if she feels like she does have any issues with thrombocytopenia.  I told her to let us know if she is going to have any upcoming surgery.  I told her to let us know if she becomes pregnant.  Of note, I did send off a von Willebrand panel on her just to make sure that we are not overlooking another coagulation issue that may be leading to the heavy cycles.  I spent a good hour or so with Ms. Hyman Hopes.  She is very charming.  She is very eloquent.  It was nice talking to her.  Again, I would told that we could see her back at any time if she has any problems.    ______________________________ Josph Macho, M.D. PRE/MEDQ  D:  12/17/2012  T:  12/18/2012  Job:  [id number]

## 2012-12-19 LAB — VON WILLEBRAND PANEL
Coagulation Factor VIII: 139 % (ref 73–140)
Ristocetin Co-factor, Plasma: 106 % (ref 42–200)
Von Willebrand Antigen, Plasma: 133 % (ref 50–217)

## 2012-12-26 ENCOUNTER — Telehealth: Payer: Self-pay | Admitting: Obstetrics & Gynecology

## 2012-12-26 NOTE — Telephone Encounter (Signed)
Patient has questions about a bill.

## 2012-12-27 ENCOUNTER — Other Ambulatory Visit: Payer: Self-pay

## 2013-01-04 ENCOUNTER — Ambulatory Visit: Payer: 59 | Admitting: Obstetrics & Gynecology

## 2013-01-07 ENCOUNTER — Encounter: Payer: Self-pay | Admitting: Obstetrics & Gynecology

## 2013-01-07 ENCOUNTER — Ambulatory Visit (INDEPENDENT_AMBULATORY_CARE_PROVIDER_SITE_OTHER): Payer: 59 | Admitting: Obstetrics & Gynecology

## 2013-01-07 ENCOUNTER — Ambulatory Visit: Payer: 59 | Admitting: Obstetrics & Gynecology

## 2013-01-07 VITALS — BP 108/70 | HR 60 | Resp 16 | Ht 61.75 in | Wt 116.2 lb

## 2013-01-07 DIAGNOSIS — T8332XA Displacement of intrauterine contraceptive device, initial encounter: Secondary | ICD-10-CM

## 2013-01-07 DIAGNOSIS — Z30431 Encounter for routine checking of intrauterine contraceptive device: Secondary | ICD-10-CM

## 2013-01-07 DIAGNOSIS — T8339XA Other mechanical complication of intrauterine contraceptive device, initial encounter: Secondary | ICD-10-CM

## 2013-01-07 NOTE — Patient Instructions (Signed)
I will see you after your ultrasound.

## 2013-01-07 NOTE — Progress Notes (Signed)
Subjective:     Patient ID: Rockney Ghee, female   DOB: 1975/10/02, 37 y.o.   MRN: [id number]  HPI 37 yo Sep WF here for IUD recheck.  Spotted for 3 weeks and then had a little heavier bleeding for a few days.  Now just back to spotting.  Flow is much, much better than a normal cycle for her.  Felt a little more "hormonal" during her cycle but she states that wasn't anything she couldn't handle.  Has not tried to feel string.    Review of Systems  All other systems reviewed and are negative.       Objective:   Physical Exam  Constitutional: She is oriented to person, place, and time. She appears well-developed and well-nourished.  Genitourinary: Cervix exhibits no motion tenderness, no discharge (cannot see IUD string) and no friability.  Musculoskeletal: Normal range of motion.  Neurological: She is alert and oriented to person, place, and time.  Skin: Skin is warm and dry.  Psychiatric: She has a normal mood and affect.       Assessment:     Recheck after IUD placement, cannot see string     Plan:     Return for limited PUS

## 2013-01-08 ENCOUNTER — Ambulatory Visit (INDEPENDENT_AMBULATORY_CARE_PROVIDER_SITE_OTHER): Payer: 59 | Admitting: Obstetrics & Gynecology

## 2013-01-08 ENCOUNTER — Ambulatory Visit (INDEPENDENT_AMBULATORY_CARE_PROVIDER_SITE_OTHER): Payer: 59

## 2013-01-08 VITALS — BP 112/68 | Ht 61.75 in | Wt 116.6 lb

## 2013-01-08 DIAGNOSIS — T8339XA Other mechanical complication of intrauterine contraceptive device, initial encounter: Secondary | ICD-10-CM

## 2013-01-08 DIAGNOSIS — T8332XA Displacement of intrauterine contraceptive device, initial encounter: Secondary | ICD-10-CM

## 2013-01-08 NOTE — Progress Notes (Signed)
37 y.o.Legally Separatedfemale here for a pelvic ultrasound due to non visible IUD strings at 6 week IUD check.    Patient's last menstrual period was 01/01/2013.  Sexually active:  yes  Contraception: IUD  FINDINGS: UTERUS: 7.5 x 4.7 x 3.9cm EMS: IUD centrally located.  From end of IUD strig to os is 6mm.   ADNEXA:   Left ovary  WNL   Right ovary WNL CUL DE SAC: no free fluid  Results discussed.  Images shown to pt.    Assessment:  Correctly placed IUD with non visible strings Plan: F/U 1 year for AEX.  Pt knows to call with any problems.

## 2013-01-09 ENCOUNTER — Encounter: Payer: Self-pay | Admitting: Obstetrics & Gynecology

## 2013-01-09 NOTE — Patient Instructions (Signed)
Please call with any concerns/new problems.

## 2013-03-12 ENCOUNTER — Other Ambulatory Visit: Payer: Self-pay | Admitting: Obstetrics & Gynecology

## 2013-04-01 ENCOUNTER — Encounter: Payer: Self-pay | Admitting: Obstetrics & Gynecology

## 2013-04-01 ENCOUNTER — Telehealth: Payer: Self-pay | Admitting: Emergency Medicine

## 2013-04-01 DIAGNOSIS — N938 Other specified abnormal uterine and vaginal bleeding: Secondary | ICD-10-CM

## 2013-04-01 NOTE — Telephone Encounter (Signed)
Spoke with patient. She confirms message as below. States that she has been having some cramping but that pain is tolerable and not taking any medication for cramps. Having bleeding at this time, but that it is "slightly more than spotting" and not heavy. Scheduled for ultrasound on 04/04/13 with Dr. Sabra Heck. Patient agreeable. Advised to return call to our office if symptoms change or bleeding worsens. Patient verbalized understanding.   Pelvic U/S scheduled and patient aware/agreeable to time.  Patient verbalized understanding of the U/S appointment cancellation policy. Advised will need to cancel within 72 business hours (3 business days) or will have $100.00 no show fee placed to account.   Routing to provider for final review. Patient agreeable to disposition. Will close encounter

## 2013-04-01 NOTE — Telephone Encounter (Signed)
Please call patient and schedule her for a PUS on Tues or Thur if possible. Thanks. MSM ----- Message ----- From: [name] Sent: 04/01/2013 6:59 AM To: [facility] Clinical Pool Subject: Non-Urgent Medical Question Hello. I've been having more bleeding lately. My period was January 20th it looked like old blood & wasn't what I'd call heavy but definitely more than it has been since Mirena was placed. This Thursday I started bleeding bright red blood out of nowhere which has continued off & on since then. It is heavy at times & I have had some clotting which I have not had at all with Mirena.

## 2013-04-04 ENCOUNTER — Ambulatory Visit (INDEPENDENT_AMBULATORY_CARE_PROVIDER_SITE_OTHER): Payer: 59 | Admitting: Obstetrics & Gynecology

## 2013-04-04 ENCOUNTER — Ambulatory Visit (INDEPENDENT_AMBULATORY_CARE_PROVIDER_SITE_OTHER): Payer: 59

## 2013-04-04 VITALS — BP 102/64 | Ht 61.75 in | Wt 119.0 lb

## 2013-04-04 DIAGNOSIS — N938 Other specified abnormal uterine and vaginal bleeding: Secondary | ICD-10-CM

## 2013-04-04 DIAGNOSIS — N949 Unspecified condition associated with female genital organs and menstrual cycle: Secondary | ICD-10-CM

## 2013-04-04 DIAGNOSIS — N920 Excessive and frequent menstruation with regular cycle: Secondary | ICD-10-CM

## 2013-04-04 NOTE — Progress Notes (Signed)
38 y.o.Legally Separatedfemale here for a pelvic ultrasound.  Patient had Mirena IUD placed for menorrhagia.  Has been doing really well with it.  Cycles are very short, about two days, and just with dark old appearing blood.  This month, however, cycle was heavy and long and had lots of clots like before the IUD was placed.  She is sure the IUD is not in the correct place or now has been dispelled from the uterus.  Patient's last menstrual period was 03/28/2013.  Sexually active:  yes  Contraception: IUD  FINDINGS: UTERUS: 8.6 x 4.8 x 4.6cm with IUD in correct location EMS: 4.63mm ADNEXA:   Left ovary 3 x 2.2 x 1.8cm   Right ovary 3.2 x 2.3 x 2.0cm CUL DE SAC: neg for free fluid  Images reviewed with patient.  She can clearly see IUD is in correct location.  She is going to monitor and if has another cycle like this, I will replace the IUD.  As she has done so well for several months, seems unlikely that the IUD isn't releasing adequate hormone but I related to her another patient who had issues like this and replacing the IUD fixed the issues.  She is comfortable with this plan.  Assessment:  Menorrhagia for one month with Mirena IUD Plan: Pt will call and give me an update with next bleeding to see if needs new IUD.  If this doesn't work for her, she will want to consider surgical options.  ~15 minutes spent with patient >50% of time was in face to face discussion of above.

## 2013-04-10 ENCOUNTER — Encounter: Payer: Self-pay | Admitting: Obstetrics & Gynecology

## 2013-04-30 ENCOUNTER — Encounter: Payer: Self-pay | Admitting: Obstetrics & Gynecology

## 2013-05-06 ENCOUNTER — Encounter: Payer: Self-pay | Admitting: Obstetrics & Gynecology

## 2013-05-07 ENCOUNTER — Telehealth: Payer: Self-pay | Admitting: Emergency Medicine

## 2013-05-07 DIAGNOSIS — Z30433 Encounter for removal and reinsertion of intrauterine contraceptive device: Secondary | ICD-10-CM

## 2013-05-07 NOTE — Telephone Encounter (Signed)
Pt wants IUD out. Can I do it tomorrow AM? If so, please call pt and put her on the schedule. Thanks. MSM ----- Message ----- From: Mary Duffy Sent: 05/06/2013 11:02 AM To: [facility] Clinical Pool Subject: Non-Urgent Medical Question I have decided to have the Mirena removed. Let me know what I need to do. No hurry though. Thank you.

## 2013-05-07 NOTE — Telephone Encounter (Signed)
Spoke with patient. She would like IUD removal as per email to Dr. Sabra Heck. Ordered and sent for precert.  Patient scheduled for removal on 05/30/13 at 0830.  Advised business office would call with any insurance coverage needed.   Routing to provider for final review. Patient agreeable to disposition. Will close encounter

## 2013-05-09 ENCOUNTER — Telehealth: Payer: Self-pay | Admitting: Obstetrics & Gynecology

## 2013-05-09 NOTE — Telephone Encounter (Signed)
Patient has an appointment for IUD removal 05/30/13. Patient is asking if she needs an ultrasound with IUD removal.

## 2013-05-10 NOTE — Telephone Encounter (Signed)
Spoke with pt who is having her IUD removed and has not been able to feel the strings. Reports SM could not see the strings at last visit as well. Pt having appt on a Thursday, and advised that [place] tech is here that day in case of difficulty. Pt relieved, as she did not want to have to come back for another visit and have 2 co-pays. Pt appreciative.

## 2013-05-20 ENCOUNTER — Ambulatory Visit (INDEPENDENT_AMBULATORY_CARE_PROVIDER_SITE_OTHER): Payer: 59 | Admitting: Internal Medicine

## 2013-05-20 VITALS — BP 124/84 | HR 74 | Temp 98.1°F | Resp 18 | Ht 62.0 in | Wt 119.6 lb

## 2013-05-20 DIAGNOSIS — R110 Nausea: Secondary | ICD-10-CM

## 2013-05-20 DIAGNOSIS — R42 Dizziness and giddiness: Secondary | ICD-10-CM

## 2013-05-20 DIAGNOSIS — D693 Immune thrombocytopenic purpura: Secondary | ICD-10-CM

## 2013-05-20 DIAGNOSIS — B009 Herpesviral infection, unspecified: Secondary | ICD-10-CM

## 2013-05-20 DIAGNOSIS — R21 Rash and other nonspecific skin eruption: Secondary | ICD-10-CM

## 2013-05-20 LAB — POCT CBC
Granulocyte percent: 55.8 %G (ref 37–80)
HCT, POC: 37.1 % — AB (ref 37.7–47.9)
Hemoglobin: 11.8 g/dL — AB (ref 12.2–16.2)
Lymph, poc: 2.6 (ref 0.6–3.4)
MCH, POC: 30.3 pg (ref 27–31.2)
MCHC: 31.8 g/dL (ref 31.8–35.4)
MCV: 95 fL (ref 80–97)
MID (cbc): 0.6 (ref 0–0.9)
POC Granulocyte: 4.1 (ref 2–6.9)
POC LYMPH %: 36.2 % (ref 10–50)
POC MID %: 8 %M (ref 0–12)
Platelet Count, POC: 49 10*3/uL — AB (ref 142–424)
RBC: 3.9 M/uL — AB (ref 4.04–5.48)
RDW, POC: 13.4 %
WBC: 7.3 10*3/uL (ref 4.6–10.2)

## 2013-05-20 MED ORDER — VALACYCLOVIR HCL 1 G PO TABS: 1000.0000 mg | ORAL_TABLET | Freq: Three times a day (TID) | ORAL | Status: DC

## 2013-05-20 MED ORDER — DOXYCYCLINE HYCLATE 100 MG PO TABS: 100.0000 mg | ORAL_TABLET | Freq: Two times a day (BID) | ORAL | Status: DC

## 2013-05-20 NOTE — Patient Instructions (Signed)
Shingles Shingles (herpes zoster) is an infection that is caused by the same virus that causes chickenpox (varicella). The infection causes a painful skin rash and fluid-filled blisters, which eventually break open, crust over, and heal. It may occur in any area of the body, but it usually affects only one side of the body or face. The pain of shingles usually lasts about 1 month. However, some people with shingles may develop long-term (chronic) pain in the affected area of the body. Shingles often occurs many years after the person had chickenpox. It is more common:  In people older than 50 years.  In people with weakened immune systems, such as those with HIV, AIDS, or cancer.  In people taking medicines that weaken the immune system, such as transplant medicines.  In people under great stress. CAUSES  Shingles is caused by the varicella zoster virus (VZV), which also causes chickenpox. After a person is infected with the virus, it can remain in the person's body for years in an inactive state (dormant). To cause shingles, the virus reactivates and breaks out as an infection in a nerve root. The virus can be spread from person to person (contagious) through contact with open blisters of the shingles rash. It will only spread to people who have not had chickenpox. When these people are exposed to the virus, they may develop chickenpox. They will not develop shingles. Once the blisters scab over, the person is no longer contagious and cannot spread the virus to others. SYMPTOMS  Shingles shows up in stages. The initial symptoms may be pain, itching, and tingling in an area of the skin. This pain is usually described as burning, stabbing, or throbbing.In a few days or weeks, a painful red rash will appear in the area where the pain, itching, and tingling were felt. The rash is usually on one side of the body in a band or belt-like pattern. Then, the rash usually turns into fluid-filled blisters. They  will scab over and dry up in approximately 2 3 weeks. Flu-like symptoms may also occur with the initial symptoms, the rash, or the blisters. These may include:  Fever.  Chills.  Headache.  Upset stomach. DIAGNOSIS  Your caregiver will perform a skin exam to diagnose shingles. Skin scrapings or fluid samples may also be taken from the blisters. This sample will be examined under a microscope or sent to a lab for further testing. TREATMENT  There is no specific cure for shingles. Your caregiver will likely prescribe medicines to help you manage the pain, recover faster, and avoid long-term problems. This may include antiviral drugs, anti-inflammatory drugs, and pain medicines. HOME CARE INSTRUCTIONS   Take a cool bath or apply cool compresses to the area of the rash or blisters as directed. This may help with the pain and itching.   Only take over-the-counter or prescription medicines as directed by your caregiver.   Rest as directed by your caregiver.  Keep your rash and blisters clean with mild soap and cool water or as directed by your caregiver.  Do not pick your blisters or scratch your rash. Apply an anti-itch cream or numbing creams to the affected area as directed by your caregiver.  Keep your shingles rash covered with a loose bandage (dressing).  Avoid skin contact with:  Babies.   Pregnant women.   Children with eczema.   Elderly people with transplants.   People with chronic illnesses, such as leukemia or AIDS.   Wear loose-fitting clothing to help ease   the pain of material rubbing against the rash.  Keep all follow-up appointments with your caregiver.If the area involved is on your face, you may receive a referral for follow-up to a specialist, such as an eye doctor (ophthalmologist) or an ear, nose, and throat (ENT) doctor. Keeping all follow-up appointments will help you avoid eye complications, chronic pain, or disability.  SEEK IMMEDIATE MEDICAL  CARE IF:   You have facial pain, pain around the eye area, or loss of feeling on one side of your face.  You have ear pain or ringing in your ear.  You have loss of taste.  Your pain is not relieved with prescribed medicines.   Your redness or swelling spreads.   You have more pain and swelling.  Your condition is worsening or has changed.   You have a feveror persistent symptoms for more than 2 3 days.  You have a fever and your symptoms suddenly get worse. MAKE SURE YOU:  Understand these instructions.  Will watch your condition.  Will get help right away if you are not doing well or get worse. Document Released: 02/07/2005 Document Revised: 11/02/2011 Document Reviewed: 09/22/2011 [facility] Patient Information 2014 [place]. Cold Sore A cold sore (fever blister) is a skin infection caused by the herpes simplex virus (HSV-1). HSV-1 is closely related to the virus that causes gential herpes (HSV-2), but they are not the same even though both viruses can cause oral and genital infections. Cold sores are small, fluid-filled sores inside of the mouth or on the lips, gums, nose, chin, cheeks, or fingers.  The herpes simplex virus can be easily passed (contagious) to other people through close personal contact, such as kissing or sharing personal items. The virus can also spread to other parts of the body, such as the eyes or genitals. Cold sores are contagious until the sores crust over completely. They often heal within 2 weeks.  Once a person is infected, the herpes simplex virus remains permanently in the body. Therefore, there is no cure for cold sores, and they often recur when a person is tired, stressed, sick, or gets too much sun. Additional factors that can cause a recurrence include hormone changes in menstruation or pregnancy, certain drugs, and cold weather.  CAUSES  Cold sores are caused by the herpes simplex virus. The virus is spread from person to person  through close contact, such as through kissing, touching the affected area, or sharing personal items such as lip balm, razors, or eating utensils.  SYMPTOMS  The first infection may not cause symptoms. If symptoms develop, the symptoms often go through different stages. Here is how a cold sore develops:   Tingling, itching, or burning is felt 1 2 days before the outbreak.   Fluid-filled blisters appear on the lips, inside the mouth, nose, or on the cheeks.   The blisters start to ooze clear fluid.   The blisters dry up and a yellow crust appears in its place.   The crust falls off.  Symptoms depend on whether it is the initial outbreak or a recurrence. Some other symptoms with the first outbreak may include:   Fever.   Sore throat.   Headache.   Muscle aches.   Swollen neck glands.  DIAGNOSIS  A diagnosis is often made based on your symptoms and looking at the sores. Sometimes, a sore may be swabbed and then examined in the lab to make a final diagnosis. If the sores are not present, blood tests can find  the herpes simplex virus.  TREATMENT  There is no cure for cold sores and no vaccine for the herpes simplex virus. Within 2 weeks, most cold sores go away on their own without treatment. Medicines cannot make the infection go away, but medicine can help relieve some of the pain associated with the sores, can work to stop the virus from multiplying, and can also shorten healing time. Medicine may be in the form of creams, gels, pills, or a shot.  HOME CARE INSTRUCTIONS   Only take over-the-counter or prescription medicines for pain, discomfort, or fever as directed by your caregiver. Do not use aspirin.   Use a cotton-tip swab to apply creams or gels to your sores.   Do not touch the sores or pick the scabs. Wash your hands often. Do not touch your eyes without washing your hands first.   Avoid kissing, oral sex, and sharing personal items until sores heal.   Apply  an ice pack on your sores for 10 15 minutes to ease any discomfort.   Avoid hot, cold, or salty foods because they may hurt your mouth. Eat a soft, bland diet to avoid irritating the sores. Use a straw to drink if you have pain when drinking out of a glass.   Keep sores clean and dry to prevent an infection of other tissues.   Avoid the sun and limit stress if these things trigger outbreaks. If sun causes cold sores, apply sunscreen on the lips before being out in the sun.  SEEK MEDICAL CARE IF:   You have a fever or persistent symptoms for more than 2 3 days.   You have a fever and your symptoms suddenly get worse.   You have pus, not clear fluid, coming from the sores.   You have redness that is spreading.   You have pain or irritation in your eye.   You get sores on your genitals.   Your sores do not heal within 2 weeks.   You have a weakened immune system.   You have frequent recurrences of cold sores.  MAKE SURE YOU:   Understand these instructions.  Will watch your condition.  Will get help right away if you are not doing well or get worse. Document Released: 02/05/2000 Document Revised: 11/02/2011 Document Reviewed: 06/22/2011 [facility] Patient Information 2014 [place].

## 2013-05-20 NOTE — Progress Notes (Signed)
   Subjective:    Patient ID: Mary Duffy, female    DOB: 06-Jun-1975, 38 y.o.   MRN: [id number]  HPI    Review of Systems     Objective:   Physical Exam        Assessment & Plan:

## 2013-05-20 NOTE — Progress Notes (Signed)
Subjective:    Patient ID: Mary Duffy, female    DOB: May 05, 1975, 38 y.o.   MRN: [id number]  HPI  38 y/o female swollen eyes x 3 days , dizziness x this am, constant worse with standing,  nausea, denies sensitivity to light and sound, has not taken anything for this. Feeling tired.  No history of dizziness.  Appetite is decreased x 1 day.  No problem with urination. No abdominal pain.  No history of anemia. 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 Fever blister on lip x 1 week. Took acyclovir last week for breakout. May be resistant now  Review of Systems  Constitutional: Positive for diaphoresis, appetite change and fatigue.  HENT: Negative.   Eyes: Positive for redness and visual disturbance. Negative for discharge.  Respiratory: Negative.   Cardiovascular: Negative.   Gastrointestinal: Negative.   Endocrine: Negative.   Genitourinary: Negative.   Musculoskeletal: Negative.   Neurological: Positive for dizziness and weakness.  Hematological: Bruises/bleeds easily.  Psychiatric/Behavioral: Negative.        Objective:   Physical Exam  Constitutional: She is oriented to person, place, and time. She appears well-developed and well-nourished. No distress.  HENT:  Head: Normocephalic.  Right Ear: External ear normal.  Left Ear: External ear normal.  Nose: Nose normal.  Mouth/Throat: Oropharynx is clear and moist.  Eyes: EOM are normal. Pupils are equal, round, and reactive to light. Right conjunctiva is not injected. Right conjunctiva has no hemorrhage. Left conjunctiva is not injected. Left conjunctiva has no hemorrhage.    Lesion above right eye Eyelids are puffy, swollen, not red  Neck: Normal range of motion. Neck supple.  Cardiovascular: Normal rate, regular rhythm and normal heart sounds.   Pulmonary/Chest: Effort normal and breath sounds normal.  Abdominal:  Soft. She exhibits no mass. There is no tenderness.  Lymphadenopathy:    She has no cervical adenopathy.  Neurological: She is alert and oriented to person, place, and time. No cranial nerve deficit. She exhibits normal muscle tone. Coordination normal.  Skin: Rash noted. She is diaphoretic. There is erythema.  Psychiatric: She has a normal mood and affect. Her behavior is normal. Judgment and thought content normal.   BP reclined 117/74, standing 124/78  Results for orders placed in visit on 05/20/13  POCT CBC      Result Value Ref Range   WBC 7.3  4.6 - 10.2 K/uL   Lymph, poc 2.6  0.6 - 3.4   POC LYMPH PERCENT 36.2  10 - 50 %L   MID (cbc) 0.6  0 - 0.9   POC MID % 8.0  0 - 12 %M   POC Granulocyte 4.1  2 - 6.9   Granulocyte percent 55.8  37 - 80 %G   RBC 3.90 (*) 4.04 - 5.48 M/uL   Hemoglobin 11.8 (*) 12.2 - 16.2 g/dL   HCT, POC 37.1 (*) 37.7 - 47.9 %   MCV 95.0  80 - 97 fL   MCH, POC 30.3  27 - 31.2 pg   MCHC 31.8  31.8 - 35.4 g/dL   RDW, POC 13.4     Platelet Count, POC 49 (*) 142 - 424 K/uL   MPV    0 - 99.8 fL         Assessment & Plan:  HA/Dizzy/Nausea HSV face/Possible staph cellulitis Valcyclovir 1g tid/Doxycycline 100mg  bid RTC tomorrow  am

## 2013-05-30 ENCOUNTER — Ambulatory Visit (INDEPENDENT_AMBULATORY_CARE_PROVIDER_SITE_OTHER): Payer: 59 | Admitting: Obstetrics & Gynecology

## 2013-05-30 ENCOUNTER — Other Ambulatory Visit: Payer: Self-pay | Admitting: Obstetrics & Gynecology

## 2013-05-30 ENCOUNTER — Ambulatory Visit (INDEPENDENT_AMBULATORY_CARE_PROVIDER_SITE_OTHER): Payer: 59

## 2013-05-30 VITALS — BP 114/62 | HR 68 | Resp 16 | Ht 61.75 in | Wt 122.0 lb

## 2013-05-30 DIAGNOSIS — Z30433 Encounter for removal and reinsertion of intrauterine contraceptive device: Secondary | ICD-10-CM

## 2013-05-30 DIAGNOSIS — Z30432 Encounter for removal of intrauterine contraceptive device: Secondary | ICD-10-CM

## 2013-05-30 NOTE — Progress Notes (Signed)
89 yrsCaucasianLegally [id number] presents for Mirena removal.  Pt having what she feels is unacceptable hormonal symptoms from the IUD.  She feels bloated and moody, like PMS, much of the time.  She feels this started after the IUD was placed.  Although her bleeding is much better, she declines continued use.    Exam: Gen:  WNWD WF, NAD Abdomen: soft non-tender Groin:no inguinal nodes palpated    Pelvic exam: VULVA: normal appearing vulva with no masses, tenderness or lesions VAGINA: normal appearing vagina with normal color and discharge, no lesions CERVIX: normal appearing cervix without discharge or lesions, IUD string not seen UTERUS: uterus is normal size, shape, consistency and nontender ADNEXA: normal adnexa in size, nontender and no masses.  Procedure: Speculum placed, cervix visualized.  IUD string not visualized.  Cervix cleansed with Betadine.  Tenaculum placed on anterior lip of cervix.  String hook used without success.    Decided to proceed under ultrasound guidance.  After several additional attempts with string hook, IUD string visualized and IUD removed without difficulty.  Pt tolerated procedure quite well.  IUD shown to patient and discarded. Speculum removed.   Assessment:Mirena removal Pt tolerated procedure well.  Plan: pt will use condoms for now.   SHe is not an ablation candidate due to the thin cesarean section scar.  She declines any other hormone use.  She is contemplating a hysterectomy and will let me know if desires to proceed with this,

## 2013-06-19 ENCOUNTER — Encounter: Payer: Self-pay | Admitting: Obstetrics & Gynecology

## 2013-06-19 NOTE — Progress Notes (Signed)
Please see separate encounter as IUD removal needed to be done under ultrasound guidance.

## 2013-11-22 ENCOUNTER — Ambulatory Visit: Payer: 59 | Admitting: Obstetrics & Gynecology

## 2013-12-04 ENCOUNTER — Ambulatory Visit: Payer: 59 | Admitting: Obstetrics & Gynecology

## 2013-12-23 ENCOUNTER — Encounter: Payer: Self-pay | Admitting: Obstetrics & Gynecology

## 2013-12-31 ENCOUNTER — Other Ambulatory Visit: Payer: Self-pay | Admitting: Obstetrics & Gynecology

## 2013-12-31 ENCOUNTER — Encounter: Payer: Self-pay | Admitting: Obstetrics & Gynecology

## 2013-12-31 MED ORDER — NORETHINDRONE 0.35 MG PO TABS: 1.0000 | ORAL_TABLET | Freq: Every day | ORAL | Status: DC

## 2013-12-31 NOTE — Telephone Encounter (Signed)
Routing to [place] for review and advised of patient's [employer]. Patient was last seen on 05/30/13 for IUD removal. Patient is requesting to start low dose birth control.

## 2014-01-28 ENCOUNTER — Ambulatory Visit (INDEPENDENT_AMBULATORY_CARE_PROVIDER_SITE_OTHER): Payer: 59 | Admitting: Nurse Practitioner

## 2014-01-28 ENCOUNTER — Encounter: Payer: Self-pay | Admitting: Nurse Practitioner

## 2014-01-28 VITALS — BP 122/80 | HR 84 | Ht 62.0 in | Wt 118.0 lb

## 2014-01-28 DIAGNOSIS — B373 Candidiasis of vulva and vagina: Secondary | ICD-10-CM

## 2014-01-28 DIAGNOSIS — Z113 Encounter for screening for infections with a predominantly sexual mode of transmission: Secondary | ICD-10-CM

## 2014-01-28 DIAGNOSIS — B3731 Acute candidiasis of vulva and vagina: Secondary | ICD-10-CM

## 2014-01-28 DIAGNOSIS — R7989 Other specified abnormal findings of blood chemistry: Secondary | ICD-10-CM

## 2014-01-28 MED ORDER — TERCONAZOLE 0.4 % VA CREA: 1.0000 | TOPICAL_CREAM | Freq: Every day | VAGINAL | Status: DC

## 2014-01-28 NOTE — Progress Notes (Signed)
38 y.o. DW Fe G4, P2 here with complaint of vaginal symptoms with some itching, burning, and increase discharge. Describes discharge as clear and no odor.  Onset of symptoms 14 days ago after taking antibiotics for URI.   She has tried OTC Monistat and Diflucan X 1.  Denies new personal products or vaginal dryness.  New partner since last spring.  Maybe some STD concerns. Urinary symptoms:  none . 05/30/13 removal of IUD.  Started OCP 2 weeks ago.  LMP: 01/14/14  O: Healthy female WDWN Affect:  normal, orientation x 3  Exam:  No acute distress Abdomen:soft and non tender Lymph node: no enlargement or tenderness Pelvic exam: External genital:  BUS: negative Vagina: thick white discharge noted. Wet prep taken Cervix: normal, non tender Uterus: non tender   Wet Prep results: PH: 4.5;  NSS: negative; KOH: + yeast   A:   Yeast vaginitis  History of ITP  R/O STD  Has history of HSV   P: Discussed findings of yeast and etiology. Discussed Aveeno or baking soda sitz bath for comfort. Avoid moist clothes or pads for extended period of time. If working out in gym clothes or swim suits for long periods of time change underwear or bottoms of swimsuit if possible. Olive Oil use for skin protection prior to activity can be used to external skin.  Rx: Terazol vaginal cream HS X 7  Follow with labs  RV prn.

## 2014-01-28 NOTE — Patient Instructions (Signed)

## 2014-01-29 LAB — CBC WITH DIFFERENTIAL/PLATELET
BASOS PCT: 3 % — AB (ref 0–1)
Basophils Absolute: 0.1 10*3/uL (ref 0.0–0.1)
Eosinophils Absolute: 0.1 10*3/uL (ref 0.0–0.7)
Eosinophils Relative: 3 % (ref 0–5)
HCT: 35.2 % — ABNORMAL LOW (ref 36.0–46.0)
HEMOGLOBIN: 11.7 g/dL — AB (ref 12.0–15.0)
Lymphocytes Relative: 31 % (ref 12–46)
Lymphs Abs: 1.5 10*3/uL (ref 0.7–4.0)
MCH: 30.4 pg (ref 26.0–34.0)
MCHC: 33.2 g/dL (ref 30.0–36.0)
MCV: 91.4 fL (ref 78.0–100.0)
Monocytes Absolute: 0.7 10*3/uL (ref 0.1–1.0)
Monocytes Relative: 14 % — ABNORMAL HIGH (ref 3–12)
NEUTROS PCT: 49 % (ref 43–77)
Neutro Abs: 2.4 10*3/uL (ref 1.7–7.7)
RBC: 3.85 MIL/uL — ABNORMAL LOW (ref 3.87–5.11)
RDW: 14 % (ref 11.5–15.5)
WBC: 4.9 10*3/uL (ref 4.0–10.5)

## 2014-01-29 LAB — IPS N GONORRHOEA AND CHLAMYDIA BY PCR

## 2014-01-29 LAB — STD PANEL
HIV 1&2 Ab, 4th Generation: NONREACTIVE
Hepatitis B Surface Ag: NEGATIVE

## 2014-02-02 NOTE — Progress Notes (Signed)
Encounter reviewed by Dr. Brook Silva.  

## 2014-04-07 ENCOUNTER — Other Ambulatory Visit: Payer: Self-pay | Admitting: Internal Medicine

## 2014-04-16 ENCOUNTER — Other Ambulatory Visit: Payer: Self-pay | Admitting: Nurse Practitioner

## 2014-04-16 NOTE — Telephone Encounter (Signed)
Needs to be seen for an appointment.  Rx declined.

## 2014-04-16 NOTE — Telephone Encounter (Signed)
Medication refill request: Terazol Vaginal Cream Last AEX:  11/02/12 Next AEX: 06/03/14 SM Last MMG (if hormonal medication request): None Refill authorized: 01/28/14 #[id number]. Today?  Routed to Dr. Quincy Simmonds. Dr. Ammie Ferrier patient

## 2014-04-16 NOTE — Telephone Encounter (Signed)
Left Voicemail for patient to call back. 

## 2014-05-28 ENCOUNTER — Encounter: Payer: Self-pay | Admitting: Nurse Practitioner

## 2014-06-03 ENCOUNTER — Ambulatory Visit (INDEPENDENT_AMBULATORY_CARE_PROVIDER_SITE_OTHER): Payer: 59 | Admitting: Obstetrics & Gynecology

## 2014-06-13 ENCOUNTER — Ambulatory Visit (INDEPENDENT_AMBULATORY_CARE_PROVIDER_SITE_OTHER): Payer: 59 | Admitting: Obstetrics & Gynecology

## 2014-06-13 ENCOUNTER — Encounter: Payer: Self-pay | Admitting: Obstetrics & Gynecology

## 2014-06-13 VITALS — BP 104/68 | HR 72 | Resp 16 | Ht 61.75 in | Wt 108.0 lb

## 2014-06-13 DIAGNOSIS — Z124 Encounter for screening for malignant neoplasm of cervix: Secondary | ICD-10-CM | POA: Diagnosis not present

## 2014-06-13 DIAGNOSIS — Z01419 Encounter for gynecological examination (general) (routine) without abnormal findings: Secondary | ICD-10-CM

## 2014-06-13 DIAGNOSIS — Z0000 Encounter for general adult medical examination without abnormal findings: Secondary | ICD-10-CM

## 2014-06-13 LAB — POCT URINALYSIS DIPSTICK
Bilirubin, UA: NEGATIVE
GLUCOSE UA: NEGATIVE
Ketones, UA: NEGATIVE
NITRITE UA: NEGATIVE
Protein, UA: NEGATIVE
UROBILINOGEN UA: NEGATIVE
pH, UA: 7

## 2014-06-13 MED ORDER — NORETHINDRONE 0.35 MG PO TABS: 1.0000 | ORAL_TABLET | Freq: Every day | ORAL | Status: DC

## 2014-06-13 NOTE — Progress Notes (Signed)
39 y.o. [id number] Divorced CaucasianF here for annual exam.  Pt was here for exam a few weeks ago and she had to leave emergently due to daughter going to the ER.  Diagnosis was eventually conversion d/o.  Pt reports she is doing ok with this diagnosis.  Pt reports she is doing well with the camilla.  Cycles lasts about five days and she can go about 6-8 weeks between cycles.  This is better for her.  Clotting is better.  Cramping is better.  Vaginal dryness is worse as a result.    PCP:  Dr. Minna Antis.  Last seen a few months ago.  UTD with labs.  Patient's last menstrual period was 05/15/2014.          Sexually active: Yes.    The current method of family planning is oral progesterone-only contraceptive.    Exercising: Yes.    gym 4 x week Smoker:  Former smoker  Health Maintenance: Pap:  11/02/12 WNL History of abnormal Pap:  yes MMG:  11/06 for breast pain Colonoscopy:  none BMD:   none TDaP:  2012 Screening Labs: Dr. Minna Antis, Hb today: 12.3, Urine today: WBC-1+, PH-7, RBC-trace   reports that she quit smoking about 14 years ago. She has never used smokeless tobacco. She reports that she drinks about 6.0 oz of alcohol per week. She reports that she does not use illicit drugs.  Past Medical History  Diagnosis Date  . Anxiety   . ITP (idiopathic thrombocytopenic purpura)     dx age 67, h/o splenectomy, nl value 80K  . Murmur     released from cardiologist as teenager  . Abnormal Pap smear 1999    colpo 1999  . Cystic fibrosis carrier   . H/O infertility   . IBS (irritable bowel syndrome)   . Fever blister   . Anemia   . Clotting disorder     Past Surgical History  Procedure Laterality Date  . Cesarean section  2002, 2004  . Splenectomy  2001  . Right ankle bone spurs  90's  . Hysteroscopy with resectoscope  7/13    Current Outpatient Prescriptions  Medication Sig Dispense Refill  . amphetamine-dextroamphetamine (ADDERALL) 10 MG tablet Take 10 mg by mouth daily with breakfast.     . Multiple Vitamin (MULTIVITAMIN WITH MINERALS) TABS Take 1 tablet by mouth daily. Uses [employer] brand.    . norethindrone (MICRONOR,CAMILA,ERRIN) 0.35 MG tablet Take 1 tablet (0.35 mg total) by mouth daily. 1 Package 6  . sertraline (ZOLOFT) 50 MG tablet Take 50 mg by mouth daily.  7  . valACYclovir (VALTREX) 1000 MG tablet Take 1,000 mg by mouth 3 (three) times daily.  1   No current facility-administered medications for this visit.    Family History  Problem Relation Age of Onset  . Heart disease Maternal Grandmother   . Hyperlipidemia Maternal Grandmother   . Breast cancer Maternal Grandmother 25  . Lung cancer Paternal Grandmother     lung  . Hypothyroidism Mother   . Hyperlipidemia Mother   . Hypertension Mother   . Migraines Mother   . Heart disease Father   . Other Daughter     conversion disorder    ROS:  Pertinent items are noted in HPI.  Otherwise, a comprehensive ROS was negative.  Exam:   BP 104/68 mmHg  Pulse 72  Resp 16  Ht 5' 1.75" (1.568 m)  Wt 108 lb (48.988 kg)  BMI 19.92 kg/m2  LMP 05/15/2014  Height: 5' 1.75" (156.8 cm)  Ht Readings from Last 3 Encounters:  06/13/14 5' 1.75" (1.568 m)  01/28/14 5\' 2"  (1.575 m)  05/30/13 5' 1.75" (1.568 m)    General appearance: alert, cooperative and appears stated age Head: Normocephalic, without obvious abnormality, atraumatic Neck: no adenopathy, supple, symmetrical, trachea midline and thyroid normal to inspection and palpation Lungs: clear to auscultation bilaterally Breasts: normal appearance, no masses or tenderness Heart: regular rate and rhythm Abdomen: soft, non-tender; bowel sounds normal; no masses,  no organomegaly Extremities: extremities normal, atraumatic, no cyanosis or edema Skin: Skin color, texture, turgor normal. No rashes or lesions Lymph nodes: Cervical, supraclavicular, and axillary nodes normal. No abnormal inguinal nodes palpated Neurologic: Grossly normal   Pelvic: External  genitalia:  no lesions              Urethra:  normal appearing urethra with no masses, tenderness or lesions              Bartholins and Skenes: normal                 Vagina: normal appearing vagina with normal color and discharge, no lesions              Cervix: no lesions              Pap taken: Yes.   Bimanual Exam:  Uterus:  normal size, contour, position, consistency, mobility, non-tender              Adnexa: normal adnexa and no mass, fullness, tenderness               Rectovaginal: Confirms               Anus:  normal sphincter tone, no lesions  Chaperone was present for exam.  A:  Well Woman with normal exam Menorrhagia improved with OCPs H/O Cesarean section x 2 Side effects with oral estrogens ITP, followed by Dr. Marin Olp H/O polyp resection with D&C Family hx of breast cancer in [facility]  P:   Mammogram guidelines and information provided Labs done this past year with Dr. Minna Antis pap smear with HR HPV obtained today return annually or prn

## 2014-06-16 LAB — HEMOGLOBIN, FINGERSTICK: Hemoglobin, fingerstick: 12.3 g/dL (ref 12.0–16.0)

## 2014-06-18 LAB — IPS PAP TEST WITH HPV

## 2014-08-27 ENCOUNTER — Other Ambulatory Visit: Payer: Self-pay

## 2014-08-27 DIAGNOSIS — Z1231 Encounter for screening mammogram for malignant neoplasm of breast: Secondary | ICD-10-CM

## 2014-09-05 ENCOUNTER — Ambulatory Visit
Admission: RE | Admit: 2014-09-05 | Discharge: 2014-09-05 | Disposition: A | Discharge status: Home / Self Care | Payer: 59 | Source: Ambulatory Visit

## 2014-09-05 DIAGNOSIS — Z1231 Encounter for screening mammogram for malignant neoplasm of breast: Secondary | ICD-10-CM

## 2014-09-09 ENCOUNTER — Other Ambulatory Visit: Payer: Self-pay | Admitting: Obstetrics & Gynecology

## 2014-09-09 DIAGNOSIS — R928 Other abnormal and inconclusive findings on diagnostic imaging of breast: Secondary | ICD-10-CM

## 2014-09-11 ENCOUNTER — Ambulatory Visit
Admission: RE | Admit: 2014-09-11 | Discharge: 2014-09-11 | Disposition: A | Discharge status: Home / Self Care | Payer: 59 | Source: Ambulatory Visit | Attending: Obstetrics & Gynecology | Admitting: Obstetrics & Gynecology

## 2014-09-11 ENCOUNTER — Other Ambulatory Visit: Payer: Self-pay | Admitting: Obstetrics & Gynecology

## 2014-09-11 DIAGNOSIS — R928 Other abnormal and inconclusive findings on diagnostic imaging of breast: Secondary | ICD-10-CM

## 2014-11-03 ENCOUNTER — Telehealth: Payer: Self-pay | Admitting: Emergency Medicine

## 2014-11-03 ENCOUNTER — Encounter: Payer: Self-pay | Admitting: Obstetrics & Gynecology

## 2014-11-03 NOTE — Telephone Encounter (Signed)
Telephone call for triage created to discuss message with patient and disposition as appropriate.   

## 2014-11-03 NOTE — Telephone Encounter (Signed)
Chief Complaint  Patient presents with  . Advice Only    Patient sent mychart mesage     ===View-only below this line===   ----- Message -----    From: [name]    Sent: 11/03/2014 12:28 PM EDT      To: Lyman Speller, MD Subject: Non-Urgent Medical Question  Hi, Dr. Sabra Heck. I tried the Replens every 3 days for 3 months and it didn't seem to help much at all except right after I'd used it. You said there was a medication I could try. Can we go ahead and do that?

## 2014-11-03 NOTE — Telephone Encounter (Signed)
Dr. Sabra Heck,  Can you review mychart message and advise?

## 2014-11-04 NOTE — Telephone Encounter (Signed)
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  If that is the case, she could try premarin vaginal cream twice weekly.

## 2014-11-04 NOTE — Telephone Encounter (Signed)
Message left to return call to Kellyn Mccary at [phone].    

## 2014-11-20 NOTE — Telephone Encounter (Signed)
Message left to return call to Mary Duffy at [phone].    

## 2014-11-26 MED ORDER — ESTROGENS, CONJUGATED 0.625 MG/GM VA CREA: TOPICAL_CREAM | VAGINAL | Status: DC

## 2014-11-26 NOTE — Telephone Encounter (Signed)
Patient returned call and advises that she has no other clotting problems other than the ITP.  Advised per Dr. Sabra Heck can order Premarin vaginal cream 1/2 gram vaginally twice weekly and instructed on use.  Patient aware of risks of estrogen use. Patient verbalized understanding. She will call back with any concerns or questions. Routing to provider for final review. Patient agreeable to disposition. Will close encounter.

## 2014-11-26 NOTE — Telephone Encounter (Signed)
Message left to return call to Kasheena Sambrano at [phone].    

## 2015-04-10 ENCOUNTER — Other Ambulatory Visit: Payer: Self-pay | Admitting: Family

## 2015-04-10 ENCOUNTER — Ambulatory Visit
Admission: RE | Admit: 2015-04-10 | Discharge: 2015-04-10 | Disposition: A | Discharge status: Home / Self Care | Payer: 59 | Source: Ambulatory Visit | Attending: Family | Admitting: Family

## 2015-04-10 DIAGNOSIS — R05 Cough: Secondary | ICD-10-CM

## 2015-04-10 DIAGNOSIS — R059 Cough, unspecified: Secondary | ICD-10-CM

## 2015-04-10 DIAGNOSIS — R509 Fever, unspecified: Secondary | ICD-10-CM

## 2015-05-26 ENCOUNTER — Ambulatory Visit (INDEPENDENT_AMBULATORY_CARE_PROVIDER_SITE_OTHER): Payer: 59 | Admitting: Obstetrics & Gynecology

## 2015-05-26 ENCOUNTER — Telehealth: Payer: Self-pay | Admitting: Emergency Medicine

## 2015-05-26 ENCOUNTER — Encounter: Payer: Self-pay | Admitting: Obstetrics & Gynecology

## 2015-05-26 VITALS — BP 110/60 | HR 98 | Temp 98.5°F | Resp 16 | Ht 61.75 in | Wt 115.0 lb

## 2015-05-26 DIAGNOSIS — R102 Pelvic and perineal pain: Secondary | ICD-10-CM

## 2015-05-26 DIAGNOSIS — D500 Iron deficiency anemia secondary to blood loss (chronic): Secondary | ICD-10-CM

## 2015-05-26 DIAGNOSIS — N920 Excessive and frequent menstruation with regular cycle: Secondary | ICD-10-CM | POA: Diagnosis not present

## 2015-05-26 DIAGNOSIS — R350 Frequency of micturition: Secondary | ICD-10-CM | POA: Diagnosis not present

## 2015-05-26 DIAGNOSIS — R140 Abdominal distension (gaseous): Secondary | ICD-10-CM

## 2015-05-26 DIAGNOSIS — N9489 Other specified conditions associated with female genital organs and menstrual cycle: Secondary | ICD-10-CM | POA: Diagnosis not present

## 2015-05-26 LAB — POCT URINALYSIS DIPSTICK
BILIRUBIN UA: NEGATIVE
Glucose, UA: NEGATIVE
Ketones, UA: NEGATIVE
LEUKOCYTES UA: NEGATIVE
NITRITE UA: NEGATIVE
Protein, UA: NEGATIVE
UROBILINOGEN UA: NEGATIVE
pH, UA: 7

## 2015-05-26 NOTE — Progress Notes (Addendum)
GYNECOLOGY  VISIT   HPI: 40 y.o. [id number] Divorced Caucasian female with complaint of "feeling pregnant but she's not".  Pt reports she's had bloating, pelvic cramping "more often than not", abdominal fullness as well as increased breast tenderness over the last few months.  It seems to be worse when she is on her cycle and her flow has continued to increase since her Mirena IUD was removed 05/30/13.  This was stopped due to unacceptable hormonal side effects.  She now reports heavy bleeding (bleeding through a tampon an hour) for two days during her cycle.  This is now accompanied by clotting.  Then, she reports her flow is more moderate for two days.  Finally, she spots for another 5-7 days.    Pt reports associated bloating and fullness typically gets better with menstrual cycle but not this past month which is partly why she is here.  Also, she is here to discuss surgery.  She is sick and tired of the bleeding and associated symptoms.  She is ready to start considering surgery and to start making some plans.  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  She has a sizable scar in LUQ.  D/W pt attempted laparoscopic approach would be started.  She does have potential for adhesions so low transverse incision may be necessary but this could be done along prior cesarean section lines.  Risks of bowel and bladder injury reviewed.  Bladder injury risk is mildly increased with her due to prior cesarean sections.  DVT/PE/MI also reviewed.  Bleeding, transfusion risk and infection discussed.  Other options discussed including endometrial ablation.   I feel repeat PUS should be performed.  Last one was about a year ago but this was for IUD placement.  Pt does not want to use any other hormonal therapy.  Has been on OCPs, POPs, and Mirena IUD without success.    Last pap 4/16.  GYNECOLOGIC HISTORY: Patient's last menstrual period was  05/17/2015.  Patient Active Problem List   Diagnosis Date Noted  . ITP (idiopathic thrombocytopenic purpura) 09/27/2011  . Menorrhagia 09/06/2011  . Dysmenorrhea 09/06/2011    Past Medical History  Diagnosis Date  . Anxiety   . ITP (idiopathic thrombocytopenic purpura)     dx age 58, h/o splenectomy, nl value 80K  . Murmur     released from cardiologist as teenager  . Abnormal Pap smear 1999    colpo 1999  . Cystic fibrosis carrier   . H/O infertility   . IBS (irritable bowel syndrome)   . Fever blister   . Anemia   . Clotting disorder [facility])     Past Surgical History  Procedure Laterality Date  . Cesarean section  2002, 2004  . Splenectomy  2001  . Right ankle bone spurs  90's  . Hysteroscopy with resectoscope  7/13    MEDS:  Reviewed in EPIC and UTD  ALLERGIES: Other  Family History  Problem Relation Age of Onset  . Heart disease Maternal Grandmother   . Hyperlipidemia Maternal Grandmother   . Breast cancer Maternal Grandmother 54  . Lung cancer Paternal Grandmother     lung  . Hypothyroidism Mother   . Hyperlipidemia Mother   . Hypertension Mother   . Migraines Mother   . Heart disease Father   . Other Daughter     conversion disorder    SH:  Divorced, non-smoker  Review of Systems  All other  systems reviewed and are negative.   PHYSICAL EXAMINATION:    BP 110/60 mmHg  Pulse 98  Temp(Src) 98.5 F (36.9 C) (Oral)  Resp 16  Ht 5' 1.75" (1.568 m)  Wt 115 lb (52.164 kg)  BMI 21.22 kg/m2  LMP 05/17/2015    General appearance: alert, cooperative and appears stated age CV:  Regular rate and rhythm Lungs:  clear to auscultation, no wheezes, rales or rhonchi, symmetric air entry on-tender; bowel sounds normal; no masses,  no organomegaly  Pelvic: External genitalia:  no lesions              Urethra:  normal appearing urethra with no masses, tenderness or lesions              Bartholins and Skenes: normal                 Vagina: normal appearing  vagina with normal color and discharge, no lesions              Cervix: no lesions              Bimanual Exam:  Uterus:  normal size, contour, position, consistency, mobility, non-tender and anteflexed              Adnexa: no mass, fullness, tenderness              Anus:  normal sphincter tone, no lesions  Endometrial biopsy recommended.  Discussed with patient.  Verbal and written consent obtained.   Procedure:  Speculum placed.  Cervix visualized and cleansed with betadine prep.  A single toothed tenaculum was applied to the anterior lip of the cervix.  Endometrial pipelle was advanced through the cervix into the endometrial cavity without difficulty.  Pipelle passed to 7.5cm.  Suction applied and pipelle removed with good tissue sample obtained.  Tenculum removed.  No bleeding noted.  Patient tolerated procedure well.  Chaperone was present for exam.  Assessment: Menorrhagia in pt that has failed OCPs, POPs and  ITP s/p splenectomy with mildly low platelet level Mild anemia on recent blood work with PCP  Plan: Consider hysterectomy vs endometrial ablation Return for PUS Endometrial biopsy pending from today CBC today.  May need consult with heme/onc prior to surgery   ~30 minutes spent with patient >50% of time was in face to face discussion of above.

## 2015-05-26 NOTE — Telephone Encounter (Signed)
Call to patient. Discussed mychart message. She complaints of 3 weeks of bloating, pelvic cramps, constipation, increased thirst and feelings of fullness after she eats small amounts. Patient states she travelled out of town and ate foods that she did not normally eat so contributed her earlier symptoms to this. Symptoms are not improving. Has felt what patient describes as hot flashes but thinks it may be experiencing fevers or chills, states over the weekend she had a temperature of 101 but has not rechecked recently.   No vaginal bleeding or discharge.   Discussed patient can be seen here by Dr. Sabra Heck or PCP, can evaluate for gyn causes and may need follow up with PCP or GI.  Patient agreeable.  Office visit today with Dr. Sabra Heck at 2:30 scheduled.  Patient advised to return call if any worsening of symptoms prior to appointment.  Routing to provider for final review. Patient agreeable to disposition. Will close encounter.

## 2015-05-26 NOTE — Telephone Encounter (Signed)
Telephone call for triage created to discuss message with patient and disposition as appropriate.   

## 2015-05-26 NOTE — Telephone Encounter (Signed)
Chief Complaint  Patient presents with  . Advice Only    Patient sent mychart message. Requires triage.   Marland Kitchen Appointment   I have been having nausea, bloating and night sweats off and on for a few weeks. My last period was 3/26. It was heavy, but nothing unusual for me. My nausea and bloating has gotten worse this past week and I have had a headache which I rarely get. Even though my period ended 3/31 I am still having menstrual type cramps. I have also had a little weight gain and fell full very quickly after eating little. I am not sure if I should come see you or my general practitioner?

## 2015-05-27 ENCOUNTER — Telehealth: Payer: Self-pay | Admitting: Obstetrics & Gynecology

## 2015-05-27 NOTE — Telephone Encounter (Signed)
Spoke with pt regarding benefit for ultrasound. Patient understood and agreeable. Patient has already been scheduled for 05/28/15 with Dr Sabra Heck. Pt aware of arrival date and time. Account notes can be referenced for additional benefit information. Ok to close

## 2015-05-28 ENCOUNTER — Ambulatory Visit (INDEPENDENT_AMBULATORY_CARE_PROVIDER_SITE_OTHER): Payer: 59

## 2015-05-28 ENCOUNTER — Ambulatory Visit (INDEPENDENT_AMBULATORY_CARE_PROVIDER_SITE_OTHER): Payer: 59 | Admitting: Obstetrics & Gynecology

## 2015-05-28 VITALS — BP 110/70 | HR 82 | Resp 16 | Ht 61.75 in | Wt 115.0 lb

## 2015-05-28 DIAGNOSIS — D693 Immune thrombocytopenic purpura: Secondary | ICD-10-CM | POA: Diagnosis not present

## 2015-05-28 DIAGNOSIS — N920 Excessive and frequent menstruation with regular cycle: Secondary | ICD-10-CM

## 2015-05-28 DIAGNOSIS — N9489 Other specified conditions associated with female genital organs and menstrual cycle: Secondary | ICD-10-CM

## 2015-05-28 DIAGNOSIS — D500 Iron deficiency anemia secondary to blood loss (chronic): Secondary | ICD-10-CM | POA: Insufficient documentation

## 2015-05-28 DIAGNOSIS — Z9081 Acquired absence of spleen: Secondary | ICD-10-CM

## 2015-05-28 DIAGNOSIS — R102 Pelvic and perineal pain: Secondary | ICD-10-CM

## 2015-05-28 NOTE — Progress Notes (Signed)
40 y.o. [id number] Divorced Caucasian female here for pelvic ultrasound due to h/o menorrhagia, failed micronor and failed Mirena IUD.  She is contemplating surgical options.  Endometrial biopsy obtianed 05/26/15 is not back yet.  I checked with pt here in my office.  Patient's last menstrual period was 05/17/2015.  Contraception: none Last pap:  06/13/14 Neg with Neg HR HPV  Findings:  UTERUS: 8.1 x 4.9 x 4.1cm EMS: 5.73mm ADNEXA: Left ovary: 2.6 x 2.4 x 1.8cm with 2.1cm simple appearing follicle       Right ovary: 2.8 x 2.3 x 1.6cm CUL DE SAC: no free fluid  Discussion:  Pt has hx of [facility] in the past where c section scar is quite thin and I do not feel comfortable with an endometrial ablation.  She has failed micronor and combination OCPs as well as a Mirena IUD.  Pt's only real option at this point is a hysterectomy.  She does have a hx of splenectomy that was started laparoscopically but converted to open procedure.  She is anxious about this happening again.  D/w pt two really areas during her surgery for complications would be with initial trochar placement and with bladder flap dissection.  Otherwise, uterus is small and I do not feel there would be a lot of scarring in the pelvis from prior surgery.  Surgical risks, hospital stay, pre-operative evaluation discussed.  Pt wonders if she should see hematology again.  I do not feel this is necessary at this time as she has not decided about surgery.  Of course, this could be set up if this helps with her decision making process.  Recent CBC showed platelet count of 58K.  Reviewed last pneumonia vaccination.  She needs update.  She will discuss with her PCP.  Pt has not made any decisions at this point.  Offered second opinion for her as well.  She will consider.  Assessment:  Menorrhagia with normal PUS Thin lower uterine segment from prior cesarean sections (34mm) Failed IUD and OCP use ITP with h/o splenectomy  Plan:  Pt will consider options and  call with any decisions. Endometrial biopsy results pending. Will reach out to Dr. Marin Olp to see if she needs OV again or prior to surgery, if pt decides to proceed in that manner. Will need pneumonia vaccine update prior to surgery  ~30 minutes spent with patient >50% of time was in face to face discussion of above.

## 2015-05-29 ENCOUNTER — Telehealth: Payer: Self-pay | Admitting: *Deleted

## 2015-05-29 NOTE — Telephone Encounter (Signed)
Patient returns call. Advised of negative endometrial biopsy as directed by Dr Sabra Heck. Patient is still considering surgery. She is aware to call back when she makes decision.   Routing to provider for final review. Patient agreeable to disposition. Will close encounter.

## 2015-05-29 NOTE — Telephone Encounter (Signed)
-----   Message from Megan Salon, MD sent at 05/28/2015  5:55 PM EDT ----- Please let pt know her endometrial biopsy was negative for abnormal cells.  Pt was here today but results were not back at that time.  She is considering surgical options and has contact information for [facility].

## 2015-05-29 NOTE — Telephone Encounter (Signed)
Call to patient, left message to call back. 

## 2015-06-05 ENCOUNTER — Encounter: Payer: Self-pay | Admitting: Obstetrics & Gynecology

## 2015-06-05 DIAGNOSIS — Z9081 Acquired absence of spleen: Secondary | ICD-10-CM | POA: Insufficient documentation

## 2015-06-08 ENCOUNTER — Telehealth: Payer: Self-pay | Admitting: Emergency Medicine

## 2015-06-08 DIAGNOSIS — Z9081 Acquired absence of spleen: Secondary | ICD-10-CM

## 2015-06-08 DIAGNOSIS — N920 Excessive and frequent menstruation with regular cycle: Secondary | ICD-10-CM

## 2015-06-08 DIAGNOSIS — N946 Dysmenorrhea, unspecified: Secondary | ICD-10-CM

## 2015-06-08 DIAGNOSIS — D693 Immune thrombocytopenic purpura: Secondary | ICD-10-CM

## 2015-06-08 NOTE — Telephone Encounter (Signed)
Referral placed for patient to see Dr. Marin Olp. Last visit 2014.   Call to patient and she is advised that referral is placed and to contact our office if she does not hear from their office in one week. Patient agreeable to plan. She will follow up as scheduled. Routing to provider for final review. Patient agreeable to disposition. Will close encounter.    cc Kerry Hough for referral update.

## 2015-06-08 NOTE — Telephone Encounter (Addendum)
-----   Message from Megan Salon, MD sent at 06/06/2015  6:50 AM EDT ----- Regarding: hematology consult   Mary Duffy is thinking about a hysterectomy.  I communicated with Dr. Marin Olp (hematologist whose seen her in the past).  He said he would like for her to come see him so they can discuss treatment to get her platelet count up before surgery.  She hasn't set a date but he needs at least three weeks to get her platelet count improved.  I think she should go ahead and see him so she'll know what the plan will be.  Thanks.

## 2015-06-22 ENCOUNTER — Telehealth: Payer: Self-pay | Admitting: Obstetrics & Gynecology

## 2015-06-22 NOTE — Telephone Encounter (Signed)
Return call and has decided she to proceed with surgery in July. She states she has seen Dr Marin Olp and states he did not see a reason for her to return unless there was a problem.  Patient anticipating [facility] and would like to schedule for July.  Surgery scheduled for 08-31-15 at 0730 at [facility] but aware time may change. Surgery instruction sheet reviewed and printed copy mailed to patient, see copy scanned to chart.   Advised will need to check with Dr Sabra Heck regarding appointment with Dr Marin Olp prior to surgery. Please advise.  Please confirm how case should be posted. TL[facility]/BS/poss cysto?

## 2015-06-22 NOTE — Telephone Encounter (Signed)
Patient is asking to talk with the surgery scheduler for Mary Duffy. Patient said she has been talking with Gay Filler.

## 2015-06-23 ENCOUNTER — Encounter: Payer: Self-pay | Admitting: Obstetrics & Gynecology

## 2015-06-23 ENCOUNTER — Ambulatory Visit (INDEPENDENT_AMBULATORY_CARE_PROVIDER_SITE_OTHER): Payer: 59 | Admitting: Obstetrics & Gynecology

## 2015-06-23 VITALS — BP 102/80 | HR 88 | Resp 18 | Ht 61.75 in | Wt 117.0 lb

## 2015-06-23 DIAGNOSIS — N920 Excessive and frequent menstruation with regular cycle: Secondary | ICD-10-CM

## 2015-06-23 DIAGNOSIS — D693 Immune thrombocytopenic purpura: Secondary | ICD-10-CM | POA: Diagnosis not present

## 2015-06-23 NOTE — Telephone Encounter (Signed)
Call to patient. Advised of instructions from Dr Sabra Heck. Patient very concerned. States she really doesn't understand need for appointment back to Dr Marin Olp. States she was told she would told she would not need to return unless there was a problem.  Advised that with ITP and planning for elective surgery, will need surgery clearance from hematology. Patient teary and upset. Offered office visit to discuss concerns with hear from Dr Sabra Heck why referral needed. After discussion, can make treatment plan and decide is desires to continue with surgical plans. Appointment today at 330.  Routing to provider for final review. Patient agreeable to disposition. Will close encounter.

## 2015-06-23 NOTE — Telephone Encounter (Signed)
She absolutely needs to see Dr. Marin Olp due to her low platelets.  I spoke to him specifically about him after her last visit.  It will take him 6-8 weeks to get her platelet count up which was conveyed to her after I called him.  She needs to go see him ASAP if she if going to have surgery in July.  We may have to delay surgery if she does not go to see him very soon.  You've scheduled it correctly.

## 2015-06-29 ENCOUNTER — Encounter: Payer: Self-pay | Admitting: Obstetrics & Gynecology

## 2015-06-30 ENCOUNTER — Telehealth: Payer: Self-pay | Admitting: *Deleted

## 2015-06-30 NOTE — Telephone Encounter (Signed)
See previous My Chart messages regarding surgery scheduling preferences.  Surgery scheduled for 09-14-15 at 0730 at [facility]. Call to patient to notify and adjust previously scheduled appointments. Left message to call back.

## 2015-07-01 ENCOUNTER — Other Ambulatory Visit: Payer: 59

## 2015-07-01 ENCOUNTER — Ambulatory Visit: Payer: 59 | Admitting: Hematology & Oncology

## 2015-07-01 ENCOUNTER — Other Ambulatory Visit: Payer: Self-pay | Admitting: Obstetrics & Gynecology

## 2015-07-01 ENCOUNTER — Ambulatory Visit: Payer: 59

## 2015-07-01 NOTE — Telephone Encounter (Signed)
Patient returning call.

## 2015-07-01 NOTE — Telephone Encounter (Signed)
Patient notified of surgery date and time. Appointments adjusted according to new date. Instruction sheet mailed with new date information.   Routing to provider for final review. Patient agreeable to disposition. Will close encounter.

## 2015-07-09 ENCOUNTER — Other Ambulatory Visit: Payer: 59

## 2015-07-09 ENCOUNTER — Ambulatory Visit: Payer: 59 | Admitting: Hematology & Oncology

## 2015-07-09 ENCOUNTER — Ambulatory Visit: Payer: 59

## 2015-07-16 NOTE — Progress Notes (Signed)
GYNECOLOGY  VISIT   HPI: 40 y.o. [id number] Divorced Caucasian female here for further discussion of possible surgery.  I recently communicated with Dr. Marin Olp who advised pt be seen for pre-surgical treatment.  Hx of ITP with platelet ct in the 50K region.  He would recommend therapy to improve this to above 100 before proceeding with surgery.  Surgical date has not been decided at this point.  Pt has to pay a specialist co-pay when she sees Dr. Marin Olp and was frustrated with phone call from nursing staff yesterday.  Because of this, advised maybe she should come back and discuss with me additional recommendations before surgery planning.  Pt and I did clearly discuss this on 05/26/15.  She states she just thought she was going to be able to this much closer to the surgery date.  Is hoping to have surgery once new deductible starts and would like to see Dr. Marin Olp then.  Depending on when she wants surgery, this is possible.  She does have some limitations with scheduling surgery so advised to go ahead and be seen.  She wants to know what he will treat her with as "steroids never work for me and that is a waste of my time and money".  Advised pt of his recommendations which do not include steroids.  Her brother has ITP and she will discuss with him what he has been given in the past.    She and I discussed with ITP, even over 50K, she may not have good functional platelets so this is why the additional treatment is important.  She voices clear understanding and desire to proceed.  All questions answered.    GYNECOLOGIC HISTORY: Patient's last menstrual period was 06/11/2015. Contraception: none--highly recommend condoms before surgery  Patient Active Problem List   Diagnosis Date Noted  . 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 Anemia due to chronic blood loss 05/28/2015  . Pelvic pressure in female 05/28/2015  . ITP (idiopathic thrombocytopenic purpura) 09/27/2011  . Menorrhagia 09/06/2011  .  Dysmenorrhea 09/06/2011    Past Medical History  Diagnosis Date  . Anxiety   . ITP (idiopathic thrombocytopenic purpura)     dx age 53, h/o splenectomy, nl value 80K  . Murmur     released from cardiologist as teenager  . Abnormal Pap smear 1999    colpo 1999  . Cystic fibrosis carrier   . H/O infertility   . IBS (irritable bowel syndrome)   . Fever blister   . Anemia   . Clotting disorder [facility])     Past Surgical History  Procedure Laterality Date  . Cesarean section  2002, 2004  . Splenectomy  2001  . Right ankle bone spurs  90's  . Hysteroscopy with resectoscope  7/13    MEDS:  Reviewed in EPIC and UTD  ALLERGIES: Other  Family History  Problem Relation Age of Onset  . Heart disease Maternal Grandmother   . Hyperlipidemia Maternal Grandmother   . Breast cancer Maternal Grandmother 25  . Lung cancer Paternal Grandmother     lung  . Hypothyroidism Mother   . Hyperlipidemia Mother   . Hypertension Mother   . Migraines Mother   . Heart disease Father   . Other Daughter     conversion disorder    SH:  divorced  Review of Systems  All other systems reviewed and are negative.   PHYSICAL EXAMINATION:    BP 102/80 mmHg  Pulse 88  Resp 18  Ht 5' 1.75" (  1.568 m)  Wt 117 lb (53.071 kg)  BMI 21.59 kg/m2  LMP 06/11/2015    No physical exam performed today  Assessment: ITP Menorrhagia with IUD failure, OCP failure, POP failure Thin cesarean section scar with significant risks for complications with endometrial ablation  Plan: Pt will see Dr. Marin Olp.  Does not need referral.  She will continue to discuss with RN in my office for surgical planning of TLH/bilateral salpingectomy/cystoscopy   ~20 minutes spent with patient >50% of time was in face to face discussion of above.

## 2015-07-22 ENCOUNTER — Ambulatory Visit (HOSPITAL_BASED_OUTPATIENT_CLINIC_OR_DEPARTMENT_OTHER): Payer: 59 | Admitting: Hematology & Oncology

## 2015-07-22 ENCOUNTER — Other Ambulatory Visit (HOSPITAL_BASED_OUTPATIENT_CLINIC_OR_DEPARTMENT_OTHER): Payer: 59

## 2015-07-22 ENCOUNTER — Ambulatory Visit: Payer: 59

## 2015-07-22 ENCOUNTER — Encounter: Payer: Self-pay | Admitting: Hematology & Oncology

## 2015-07-22 VITALS — BP 112/82 | HR 87 | Temp 98.0°F | Resp 20 | Ht 61.0 in | Wt 121.0 lb

## 2015-07-22 DIAGNOSIS — Z87891 Personal history of nicotine dependence: Secondary | ICD-10-CM

## 2015-07-22 DIAGNOSIS — D693 Immune thrombocytopenic purpura: Secondary | ICD-10-CM

## 2015-07-22 LAB — CBC WITH DIFFERENTIAL (CANCER CENTER ONLY)
BASO#: 0.1 10*3/uL (ref 0.0–0.2)
BASO%: 1.1 % (ref 0.0–2.0)
EOS%: 4 % (ref 0.0–7.0)
Eosinophils Absolute: 0.3 10*3/uL (ref 0.0–0.5)
HEMATOCRIT: 36 % (ref 34.8–46.6)
HEMOGLOBIN: 11.9 g/dL (ref 11.6–15.9)
LYMPH#: 2.7 10*3/uL (ref 0.9–3.3)
LYMPH%: 33.5 % (ref 14.0–48.0)
MCH: 31.1 pg (ref 26.0–34.0)
MCHC: 33.1 g/dL (ref 32.0–36.0)
MCV: 94 fL (ref 81–101)
MONO#: 0.9 10*3/uL (ref 0.1–0.9)
MONO%: 11 % (ref 0.0–13.0)
NEUT%: 50.4 % (ref 39.6–80.0)
NEUTROS ABS: 4.1 10*3/uL (ref 1.5–6.5)
Platelets: 70 10*3/uL — ABNORMAL LOW (ref 145–400)
RBC: 3.83 10*6/uL (ref 3.70–5.32)
RDW: 13.4 % (ref 11.1–15.7)
WBC: 8.2 10*3/uL (ref 3.9–10.0)

## 2015-07-22 LAB — CHCC SATELLITE - SMEAR

## 2015-07-22 NOTE — Progress Notes (Signed)
Referral MD  Reason for Referral: Chronic immune thrombocytopenia   Chief Complaint  Patient presents with  . Other    New Patient  : Need to have a hysterectomy. My platelets are low.  HPI: Mary Duffy is a very charming 40 year old white female. I have seen her in the past. I think I last saw her probably about 3 years ago. 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 This probably was about 16 years ago. she's never had a problem with bleeding.  She, unfortunately, these have a hysterectomy. She is having a lot of heavy menstrual bleeding. She is becoming more limited because of this. It is hard for her to do a lot of things with her kids.  She sees Dr. Edwinna Areola. Dr. Sabra Heck has her set up for a hysterectomy in July, on July 24.  Mary Duffy platelet counts have been lower.  Last platelet count that I have on her was from 2014 and her platelet count was 105,000.  She has had no bleeding. She has had no bruising. She is not a vegetarian. She is not taking aspirin or nonsteroidals. She has had no weight loss or weight gain. She is still working. She actually works across the street from our office.  She has had no fever. There's been no cough. She's had no recent infections.  There is no history of HIV or hepatitis exposure.  She's had no tobacco exposure. She drinks [place] on occasion.  She has had no surgeries since we last saw her. She has really not been on any of her medications.  Overall, her performance status is ECOG 0.    Past Medical History  Diagnosis Date  . Anxiety   . 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 Murmur     released from cardiologist as teenager  . Abnormal Pap smear 1999    colpo 1999  . Cystic fibrosis carrier   . H/O infertility   . IBS (irritable bowel syndrome)   . Fever blister   . Anemia   . Clotting disorder [facility])   :  Past Surgical History   Procedure Laterality Date  . Cesarean section  2002, 2004  . Splenectomy  2001  . Right ankle bone spurs  90's  . Hysteroscopy with resectoscope  7/13  :   Current outpatient prescriptions:  .  DULoxetine (CYMBALTA) 60 MG capsule, Take 60 mg by mouth daily., Disp: , Rfl: 1 .  Multiple Vitamin (MULTIVITAMIN WITH MINERALS) TABS, Take 1 tablet by mouth daily. Uses [employer] brand., Disp: , Rfl:  .  valACYclovir (VALTREX) 1000 MG tablet, Take 1,000 mg by mouth 3 (three) times daily as needed. Reported on 07/22/2015, Disp: , Rfl: 1:  :  Allergies  Allergen Reactions  . Other     IVIG  :  Family History  Problem Relation Age of Onset  . Heart disease Maternal Grandmother   . Hyperlipidemia Maternal Grandmother   . Breast cancer Maternal Grandmother 47  . Lung cancer Paternal Grandmother     lung  . Hypothyroidism Mother   . Hyperlipidemia Mother   . Hypertension Mother   . Migraines Mother   . Heart disease Father   . Other Daughter     conversion disorder  :  Social History   Social History  . Marital Status: Divorced    Spouse Name: N/A  . Number  of Children: N/A  . Years of Education: N/A   Occupational History  . Not on file.   Social History Main Topics  . Smoking status: Former Smoker -- 10 years    Quit date: 03/28/2000  . Smokeless tobacco: Never Used  . Alcohol Use: 6.0 oz/week    10 Standard drinks or equivalent per week  . Drug Use: No  . Sexual Activity:    Partners: Male    Birth Control/ Protection: Pill   Other Topics Concern  . Not on file   Social History Narrative  :  Pertinent items are noted in HPI.  Exam: @IPVITALS @ Well-developed and well-nourished white female in no obvious distress. Vital signs are temperature of 98. Pulse 87. Blood pressure 112/82. Weight is 121 pounds. Head and neck exam shows no ocular or oral lesions. She has no palpable cervical or supraclavicular lymph nodes. Lungs are clear bilaterally. Cardiac exam regular rate  and rhythm with no murmurs, rubs or bruits. Abdomen is soft. She has good bowel sounds. She has a splenectomy scar that is laparoscopic. There is no palpable hepatomegaly. Back exam shows no tenderness over the spine, ribs or hips. Extremities shows no clubbing, cyanosis or edema. Neurological exam shows no focal neurological deficits. Skin exam shows no rashes, ecchymoses or petechia.    Recent Labs  07/22/15 1452  WBC 8.2  HGB 11.9  HCT 36.0  PLT 70 Platelet count consistent in citrate*   No results for input(s): NA, K, CL, CO2, GLUCOSE, BUN, CREATININE, CALCIUM in the last 72 hours.  Blood smear review: Normochromic and normocytic population of red blood cells. There are no schistocytes. I see no spherocytes. She has no target cells. There is no nucleated red blood cells. White blood cells appear normal in morphology and maturation. She has no immature myeloid or lymphoid cells. Platelets are decreased in number. Platelets are large in size. Platelets are well granulated.  Pathology: None     Assessment and Plan: Mary Duffy is a 40 year old white female. She has history of chronic immune thrombocytopenia. She has a mildly depressed platelet count.  For her to have her surgery, we need to get her platelet count higher. I think the easiest way to do this is with Nplate. I did this will help. It will be tolerable. She had very few in the way of side effects.  I think that if we can get her platelet count above 80-90,000, then she should be in very good shape with respect to bleeding risk. I would think that even with mild thrombocytopenia, her risk of bleeding would be very low as her platelets are very functional.  I spoke to her for about 45 minutes. She understands the reason to get her platelet count higher. I think for robotic surgery or laparoscopic surgery, it would be a lot easier for her to recover as minimally invasive surgery would be a lot easy or for her. I want to make sure that  he would be safe for her and have less competitions for the surgeon.  As her surgery is on July 24, we will start the Nplate on July 6. I would then be able to get her back weekly and check her platelet count.  Again, I feel confident that we can get her plan account two-level that will be safe.  I answered all of her questions.

## 2015-07-27 ENCOUNTER — Encounter: Payer: Self-pay | Admitting: Nurse Practitioner

## 2015-07-29 ENCOUNTER — Telehealth: Payer: Self-pay | Admitting: Emergency Medicine

## 2015-08-13 ENCOUNTER — Ambulatory Visit: Payer: 59 | Admitting: Obstetrics & Gynecology

## 2015-08-27 ENCOUNTER — Ambulatory Visit: Payer: 59 | Admitting: Obstetrics & Gynecology

## 2015-08-28 ENCOUNTER — Other Ambulatory Visit (HOSPITAL_BASED_OUTPATIENT_CLINIC_OR_DEPARTMENT_OTHER): Payer: 59

## 2015-08-28 ENCOUNTER — Ambulatory Visit: Payer: 59 | Admitting: Obstetrics & Gynecology

## 2015-08-28 ENCOUNTER — Ambulatory Visit (HOSPITAL_BASED_OUTPATIENT_CLINIC_OR_DEPARTMENT_OTHER): Payer: 59

## 2015-08-28 VITALS — BP 111/81 | HR 92 | Temp 98.1°F | Resp 20

## 2015-08-28 DIAGNOSIS — D693 Immune thrombocytopenic purpura: Secondary | ICD-10-CM

## 2015-08-28 LAB — CBC WITH DIFFERENTIAL (CANCER CENTER ONLY)
BASO#: 0.1 10*3/uL (ref 0.0–0.2)
BASO%: 1.4 % (ref 0.0–2.0)
EOS%: 3.7 % (ref 0.0–7.0)
Eosinophils Absolute: 0.2 10*3/uL (ref 0.0–0.5)
HCT: 34.4 % — ABNORMAL LOW (ref 34.8–46.6)
HGB: 11.4 g/dL — ABNORMAL LOW (ref 11.6–15.9)
LYMPH#: 2.2 10*3/uL (ref 0.9–3.3)
LYMPH%: 35.1 % (ref 14.0–48.0)
MCH: 30.9 pg (ref 26.0–34.0)
MCHC: 33.1 g/dL (ref 32.0–36.0)
MCV: 93 fL (ref 81–101)
MONO#: 0.7 10*3/uL (ref 0.1–0.9)
MONO%: 11.2 % (ref 0.0–13.0)
NEUT%: 48.6 % (ref 39.6–80.0)
NEUTROS ABS: 3 10*3/uL (ref 1.5–6.5)
Platelets: 126 10*3/uL — ABNORMAL LOW (ref 145–400)
RBC: 3.69 10*6/uL — AB (ref 3.70–5.32)
RDW: 13.9 % (ref 11.1–15.7)
WBC: 6.2 10*3/uL (ref 3.9–10.0)

## 2015-08-28 LAB — TECHNOLOGIST REVIEW CHCC SATELLITE

## 2015-08-28 MED ORDER — ROMIPLOSTIM 250 MCG ~~LOC~~ SOLR
55.0000 ug | Freq: Once | SUBCUTANEOUS | Status: AC
  Administered 2015-08-28: 55 ug via SUBCUTANEOUS
  Filled 2015-08-28: qty 0.11

## 2015-08-28 NOTE — Progress Notes (Signed)
Ok to start with 1 mcg/kg per Dr. Marin Olp for first Nplate injection.

## 2015-08-28 NOTE — Patient Instructions (Signed)
Romiplostim injection What is this medicine? ROMIPLOSTIM (roe mi PLOE stim) helps your body make more platelets. This medicine is used to treat low platelets caused by chronic idiopathic thrombocytopenic purpura (ITP). This medicine may be used for other purposes; ask your health care provider or pharmacist if you have questions. What should I tell my health care provider before I take this medicine? They need to know if you have any of these conditions: -cancer or myelodysplastic syndrome -low blood counts, like low white cell, platelet, or red cell counts -take medicines that treat or prevent blood clots -an unusual or allergic reaction to romiplostim, mannitol, other medicines, foods, dyes, or preservatives -pregnant or trying to get pregnant -breast-feeding How should I use this medicine? This medicine is for injection under the skin. It is given by a health care professional in a hospital or clinic setting. A special MedGuide will be given to you before your injection. Read this information carefully each time. Talk to your pediatrician regarding the use of this medicine in children. Special care may be needed. Overdosage: If you think you have taken too much of this medicine contact a poison control center or emergency room at once. NOTE: This medicine is only for you. Do not share this medicine with others. What if I miss a dose? It is important not to miss your dose. Call your doctor or health care professional if you are unable to keep an appointment. What may interact with this medicine? Interactions are not expected. This list may not describe all possible interactions. Give your health care provider a list of all the medicines, herbs, non-prescription drugs, or dietary supplements you use. Also tell them if you smoke, drink alcohol, or use illegal drugs. Some items may interact with your medicine. What should I watch for while using this medicine? Your condition will be monitored  carefully while you are receiving this medicine. Visit your prescriber or health care professional for regular checks on your progress and for the needed blood tests. It is important to keep all appointments. What side effects may I notice from receiving this medicine? Side effects that you should report to your doctor or health care professional as soon as possible: -allergic reactions like skin rash, itching or hives, swelling of the face, lips, or tongue -shortness of breath, chest pain, swelling in a leg -unusual bleeding or bruising Side effects that usually do not require medical attention (report to your doctor or health care professional if they continue or are bothersome): -dizziness -headache -muscle aches -pain in arms and legs -stomach pain -trouble sleeping This list may not describe all possible side effects. Call your doctor for medical advice about side effects. You may report side effects to FDA at 1-800-FDA-1088. Where should I keep my medicine? This drug is given in a hospital or clinic and will not be stored at home. NOTE: This sheet is a summary. It may not cover all possible information. If you have questions about this medicine, talk to your doctor, pharmacist, or health care provider.    2016, Elsevier/Gold Standard. (2007-10-08 15:13:04)  

## 2015-09-01 ENCOUNTER — Ambulatory Visit (INDEPENDENT_AMBULATORY_CARE_PROVIDER_SITE_OTHER): Payer: 59 | Admitting: Obstetrics & Gynecology

## 2015-09-01 ENCOUNTER — Encounter: Payer: Self-pay | Admitting: Obstetrics & Gynecology

## 2015-09-01 VITALS — BP 112/70 | HR 68 | Resp 14 | Ht 62.0 in | Wt 120.8 lb

## 2015-09-01 DIAGNOSIS — D693 Immune thrombocytopenic purpura: Secondary | ICD-10-CM

## 2015-09-01 DIAGNOSIS — Z01419 Encounter for gynecological examination (general) (routine) without abnormal findings: Secondary | ICD-10-CM | POA: Diagnosis not present

## 2015-09-01 DIAGNOSIS — Z124 Encounter for screening for malignant neoplasm of cervix: Secondary | ICD-10-CM

## 2015-09-01 DIAGNOSIS — N920 Excessive and frequent menstruation with regular cycle: Secondary | ICD-10-CM

## 2015-09-01 MED ORDER — OXYCODONE-ACETAMINOPHEN 5-325 MG PO TABS: 1.0000 | ORAL_TABLET | ORAL | Status: DC | PRN

## 2015-09-01 MED ORDER — IBUPROFEN 800 MG PO TABS: 800.0000 mg | ORAL_TABLET | Freq: Three times a day (TID) | ORAL | Status: DC | PRN

## 2015-09-01 NOTE — Progress Notes (Signed)
40 y.o. [id number] Divorced Caucasian F here for annual exam and to discuss upcoming surgery.  Pt has been referred to Dr. Marin Olp and is receiving Nplat for increasing platelet level.  She has decided to proceed with definitive treatment.  She is aware with two prior cesarean sections and h/o splenectomy, there may be done issues with adhesions.  We discussed this extensively today.    Cycles continue to be heavy with clots.  Has failed Mirena IUD and OCPs including progesterone only.  Pt's lower uterine segment is very thin and I do not think and endometrial ablation is a good idea due to increased bladder risks.  Pt and I have discussed definitive treatment and she is ready to proceed with this.  Procedure discussed with patient.  Hospital stay, recovery and pain management all discussed.  Risks discussed including but not limited to bleeding, 1% risk of receiving a  transfusion, infection, 3-4% risk of bowel/bladder/ureteral/vascular injury discussed as well as possible need for additional surgery if injury does occur discussed.  DVT/PE and rare risk of death discussed.  My actual complications with prior surgeries discussed.  Vaginal cuff dehiscence discussed.  Hernia formation discussed.  Positioning and incision locations discussed.  Patient aware if pathology abnormal she may need additional treatment. She is also aware if there is extensive adhesions, she may need a low transverse incision.  Consent will include this as well.  Pt comfortable with plan.  All questions answered.    Patient's last menstrual period was 08/22/2015.          Sexually active: Yes.    The current method of family planning is none.    Exercising: Yes.    walking, running Smoker:  no  Health Maintenance: Pap:  06/13/2014 negative, HR HPV negative   History of abnormal Pap:  yes MMG:  09/19/2014 BIRADS 2 benign Colonoscopy:  never BMD:   never TDaP:  2015  Pneumonia vaccine(s):  ?2010 Zostavax:   never Hep C testing:  not indicated Screening Labs: pre op labs last week with Dr. Marin Olp, Hb today: pre op labs last week with Dr. Marin Olp , Urine today: declined   reports that she quit smoking about 15 years ago. She has never used smokeless tobacco. She reports that she drinks about 6.0 oz of alcohol per week. She reports that she does not use illicit drugs.  Past Medical History  Diagnosis Date  . Anxiety   . ITP (idiopathic thrombocytopenic purpura)     dx age 41, h/o splenectomy, nl value 80K  . Murmur     released from cardiologist as teenager  . Abnormal Pap smear 1999    colpo 1999  . Cystic fibrosis carrier   . H/O infertility   . IBS (irritable bowel syndrome)   . Fever blister   . Anemia   . Clotting disorder [facility])     Past Surgical History  Procedure Laterality Date  . Cesarean section  2002, 2004  . Splenectomy  2001  . Right ankle bone spurs  90's  . Hysteroscopy with resectoscope  7/13    Current Outpatient Prescriptions  Medication Sig Dispense Refill  . Multiple Vitamin (MULTIVITAMIN WITH MINERALS) TABS Take 1 tablet by mouth daily. Uses [employer] brand.    . sertraline (ZOLOFT) 50 MG tablet Take 50 mg by mouth daily.    . valACYclovir (VALTREX) 1000 MG tablet Take 1,000 mg by mouth 3 (three) times daily as needed. Reported on 09/01/2015  1   No current  facility-administered medications for this visit.    Family History  Problem Relation Age of Onset  . Heart disease Maternal Grandmother   . Hyperlipidemia Maternal Grandmother   . Breast cancer Maternal Grandmother 32  . Lung cancer Paternal Grandmother     lung  . Hypothyroidism Mother   . Hyperlipidemia Mother   . Hypertension Mother   . Migraines Mother   . Heart disease Father   . Other Daughter     conversion disorder    ROS:  Pertinent items are noted in HPI.  Otherwise, a comprehensive ROS was negative.  Exam:   BP 112/70 mmHg  Pulse 68  Resp 14  Ht 5\' 2"  (1.575 m)  Wt 120 lb 12.8 oz (54.795 kg)  BMI 22.09  kg/m2  LMP 08/22/2015  Weight change: @WEIGHTCHANGE @ Height:   Height: 5\' 2"  (157.5 cm)  Ht Readings from Last 3 Encounters:  09/01/15 5\' 2"  (1.575 m)  07/22/15 5\' 1"  (1.549 m)  06/23/15 5' 1.75" (1.568 m)    General appearance: alert, cooperative and appears stated age Head: Normocephalic, without obvious abnormality, atraumatic Neck: no adenopathy, supple, symmetrical, trachea midline and thyroid normal to inspection and palpation Lungs: clear to auscultation bilaterally Breasts: normal appearance, no masses or tenderness Heart: regular rate and rhythm Abdomen: soft, non-tender; bowel sounds normal; no masses,  no organomegaly Extremities: extremities normal, atraumatic, no cyanosis or edema Skin: Skin color, texture, turgor normal. No rashes or lesions Lymph nodes: Cervical, supraclavicular, and axillary nodes normal. No abnormal inguinal nodes palpated Neurologic: Grossly normal   Pelvic: External genitalia:  no lesions              Urethra:  normal appearing urethra with no masses, tenderness or lesions              Bartholins and Skenes: normal                 Vagina: normal appearing vagina with normal color and discharge, no lesions              Cervix: no lesions              Pap taken: Yes.   Bimanual Exam:  Uterus:  normal size, contour, position, consistency, mobility, non-tender              Adnexa: normal adnexa and no mass, fullness, tenderness               Rectovaginal: Confirms               Anus:  normal sphincter tone, no lesions  Chaperone was present for exam.  A:  Well Woman with normal exam Menorrhagia, failed OCPs (POP and combination due to side effects) and Mirena IUD.  Has decided to proceed with definitive management H/O Cesarean section x 2 ITP, followed by Dr. Marin Olp.  H/O open splenectomy. H/O polyp resection with D&C in the past Family hx of breast cancer in [place]  P: Mammogram 6/16.  Guidelines reviewed with pt Lab work done with Dr.  Marin Olp and pre-op lab work will be done as well         pap smear obtained today.  H/O neg pap with neg HR HPV 4/16 TLH with bilateral salpingectomy/possible BSO, cystoscopy is planned Return annually or prn  ~In addition to AEX, additional 15 minutes spent with patient discussing history, possible treatment options, possible surgical risks and complications, incision locations, ITP and where base platelet level will need to  be before surgery.  She is aware of importance of follow up with Dr. Marin Olp.  As well, additional DVT prophylaxis with Lovenox will not be used.  SCDs will be used but concern for bleeding exists and due to body size and non smoking status, I feel risk of bleeding outweighs decrease in DVT risk.  Pt and I discussed this extensively as well and she is in agreement with plan.

## 2015-09-03 ENCOUNTER — Other Ambulatory Visit (HOSPITAL_BASED_OUTPATIENT_CLINIC_OR_DEPARTMENT_OTHER): Payer: 59

## 2015-09-03 ENCOUNTER — Ambulatory Visit (HOSPITAL_BASED_OUTPATIENT_CLINIC_OR_DEPARTMENT_OTHER): Payer: 59

## 2015-09-03 VITALS — BP 106/72 | HR 71 | Temp 98.2°F | Resp 16

## 2015-09-03 DIAGNOSIS — D693 Immune thrombocytopenic purpura: Secondary | ICD-10-CM

## 2015-09-03 LAB — CBC WITH DIFFERENTIAL (CANCER CENTER ONLY)
BASO#: 0.1 10*3/uL (ref 0.0–0.2)
BASO%: 1.5 % (ref 0.0–2.0)
EOS%: 3.6 % (ref 0.0–7.0)
Eosinophils Absolute: 0.2 10*3/uL (ref 0.0–0.5)
HCT: 35.9 % (ref 34.8–46.6)
HGB: 11.7 g/dL (ref 11.6–15.9)
LYMPH#: 2.2 10*3/uL (ref 0.9–3.3)
LYMPH%: 37.7 % (ref 14.0–48.0)
MCH: 30.8 pg (ref 26.0–34.0)
MCHC: 32.6 g/dL (ref 32.0–36.0)
MCV: 95 fL (ref 81–101)
MONO#: 0.7 10*3/uL (ref 0.1–0.9)
MONO%: 11.1 % (ref 0.0–13.0)
NEUT%: 46.1 % (ref 39.6–80.0)
NEUTROS ABS: 2.7 10*3/uL (ref 1.5–6.5)
Platelets: 80 10*3/uL — ABNORMAL LOW (ref 145–400)
RBC: 3.8 10*6/uL (ref 3.70–5.32)
RDW: 14 % (ref 11.1–15.7)
WBC: 5.8 10*3/uL (ref 3.9–10.0)

## 2015-09-03 LAB — TECHNOLOGIST REVIEW CHCC SATELLITE

## 2015-09-03 MED ORDER — ROMIPLOSTIM 250 MCG ~~LOC~~ SOLR
1.0000 ug/kg | Freq: Once | SUBCUTANEOUS | Status: AC
  Administered 2015-09-03: 55 ug via SUBCUTANEOUS
  Filled 2015-09-03: qty 0.11

## 2015-09-03 NOTE — Patient Instructions (Signed)
Romiplostim injection What is this medicine? ROMIPLOSTIM (roe mi PLOE stim) helps your body make more platelets. This medicine is used to treat low platelets caused by chronic idiopathic thrombocytopenic purpura (ITP). This medicine may be used for other purposes; ask your health care provider or pharmacist if you have questions. What should I tell my health care provider before I take this medicine? They need to know if you have any of these conditions: -cancer or myelodysplastic syndrome -low blood counts, like low white cell, platelet, or red cell counts -take medicines that treat or prevent blood clots -an unusual or allergic reaction to romiplostim, mannitol, other medicines, foods, dyes, or preservatives -pregnant or trying to get pregnant -breast-feeding How should I use this medicine? This medicine is for injection under the skin. It is given by a health care professional in a hospital or clinic setting. A special MedGuide will be given to you before your injection. Read this information carefully each time. Talk to your pediatrician regarding the use of this medicine in children. Special care may be needed. Overdosage: If you think you have taken too much of this medicine contact a poison control center or emergency room at once. NOTE: This medicine is only for you. Do not share this medicine with others. What if I miss a dose? It is important not to miss your dose. Call your doctor or health care professional if you are unable to keep an appointment. What may interact with this medicine? Interactions are not expected. This list may not describe all possible interactions. Give your health care provider a list of all the medicines, herbs, non-prescription drugs, or dietary supplements you use. Also tell them if you smoke, drink alcohol, or use illegal drugs. Some items may interact with your medicine. What should I watch for while using this medicine? Your condition will be monitored  carefully while you are receiving this medicine. Visit your prescriber or health care professional for regular checks on your progress and for the needed blood tests. It is important to keep all appointments. What side effects may I notice from receiving this medicine? Side effects that you should report to your doctor or health care professional as soon as possible: -allergic reactions like skin rash, itching or hives, swelling of the face, lips, or tongue -shortness of breath, chest pain, swelling in a leg -unusual bleeding or bruising Side effects that usually do not require medical attention (report to your doctor or health care professional if they continue or are bothersome): -dizziness -headache -muscle aches -pain in arms and legs -stomach pain -trouble sleeping This list may not describe all possible side effects. Call your doctor for medical advice about side effects. You may report side effects to FDA at 1-800-FDA-1088. Where should I keep my medicine? This drug is given in a hospital or clinic and will not be stored at home. NOTE: This sheet is a summary. It may not cover all possible information. If you have questions about this medicine, talk to your doctor, pharmacist, or health care provider.    2016, Elsevier/Gold Standard. (2007-10-08 15:13:04)  

## 2015-09-03 NOTE — Patient Instructions (Signed)
Your procedure is scheduled on:  Monday, September 14, 2015  Enter through the Main Entrance of [facility] at:  6:00 AM  Pick up the phone at the desk and dial [phone].  Call this number if you have problems the morning of surgery: [phone].  Remember: Do NOT eat food or drink after:  Midnight Sunday, September 13, 2015  Take these medicines the morning of surgery with a SIP OF WATER:  Zoloft  Do NOT wear jewelry (body piercing), metal hair clips/bobby pins, make-up, or nail polish. Do NOT wear lotions, powders, or perfumes.  You may wear deodorant. Do NOT shave for 48 hours prior to surgery. Do NOT bring valuables to the hospital. Contacts, dentures, or bridgework may not be worn into surgery.  Leave suitcase in car.  After surgery it may be brought to your room.  For patients admitted to the hospital, checkout time is 11:00 AM the day of discharge.

## 2015-09-04 ENCOUNTER — Encounter (HOSPITAL_COMMUNITY): Payer: Self-pay

## 2015-09-04 ENCOUNTER — Encounter: Payer: Self-pay | Admitting: Obstetrics & Gynecology

## 2015-09-04 ENCOUNTER — Telehealth: Payer: Self-pay | Admitting: Emergency Medicine

## 2015-09-04 ENCOUNTER — Encounter (HOSPITAL_COMMUNITY)
Admission: RE | Admit: 2015-09-04 | Discharge: 2015-09-04 | Disposition: A | Discharge status: Home / Self Care | Payer: 59 | Source: Ambulatory Visit | Attending: Obstetrics & Gynecology | Admitting: Obstetrics & Gynecology

## 2015-09-04 DIAGNOSIS — Z01812 Encounter for preprocedural laboratory examination: Secondary | ICD-10-CM | POA: Diagnosis not present

## 2015-09-04 LAB — IPS PAP TEST WITH REFLEX TO HPV

## 2015-09-04 NOTE — Telephone Encounter (Signed)
Telephone call for triage created to discuss message with patient and disposition as appropriate.   

## 2015-09-04 NOTE — Telephone Encounter (Signed)
Responded to pt via Mychart.  50K is the lower limit for her platelet ct.  Ok to close encounter.

## 2015-09-04 NOTE — Telephone Encounter (Signed)
Routing mychart message to Dr. Miller to review and advise.   

## 2015-09-04 NOTE — Telephone Encounter (Signed)
Patient sent mychart message to Dr. Sabra Heck:    ===View-only below this line===   ----- Message -----    From: [name]    Sent: 09/04/2015  9:48 AM EDT      To: Lyman Speller, MD Subject: Visit Follow-Up Question  Hello, Dr. Sabra Heck. When I went to my appointment yesterday to get my second nplate my platelet count had dropped to 80k. I didn't see Dr. Marin Olp but the nurse said it wasn't unusual for the injection not to work the first time. She didn't say if it was normal for them to drop. Mine were at [employer] before my first injection. I have another injection scheduled in a week. I was just wondering what is the lowest they can be and you to still do surgery or what you were thinking. Fingers crossed this time it works and they will go back up!

## 2015-09-07 ENCOUNTER — Ambulatory Visit: Payer: 59 | Admitting: Obstetrics & Gynecology

## 2015-09-10 ENCOUNTER — Ambulatory Visit: Payer: 59

## 2015-09-10 ENCOUNTER — Ambulatory Visit: Payer: 59 | Admitting: Hematology & Oncology

## 2015-09-10 ENCOUNTER — Other Ambulatory Visit: Payer: 59

## 2015-09-11 ENCOUNTER — Ambulatory Visit (HOSPITAL_BASED_OUTPATIENT_CLINIC_OR_DEPARTMENT_OTHER): Payer: 59 | Admitting: Hematology & Oncology

## 2015-09-11 ENCOUNTER — Ambulatory Visit (HOSPITAL_BASED_OUTPATIENT_CLINIC_OR_DEPARTMENT_OTHER): Payer: 59

## 2015-09-11 ENCOUNTER — Other Ambulatory Visit (HOSPITAL_BASED_OUTPATIENT_CLINIC_OR_DEPARTMENT_OTHER): Payer: 59

## 2015-09-11 ENCOUNTER — Encounter: Payer: Self-pay | Admitting: Hematology & Oncology

## 2015-09-11 VITALS — BP 123/75 | HR 82 | Temp 98.1°F | Resp 16 | Ht 62.0 in | Wt 123.0 lb

## 2015-09-11 DIAGNOSIS — D693 Immune thrombocytopenic purpura: Secondary | ICD-10-CM

## 2015-09-11 DIAGNOSIS — D696 Thrombocytopenia, unspecified: Secondary | ICD-10-CM

## 2015-09-11 LAB — CBC WITH DIFFERENTIAL (CANCER CENTER ONLY)
BASO#: 0.1 10*3/uL (ref 0.0–0.2)
BASO%: 1.6 % (ref 0.0–2.0)
EOS ABS: 0.3 10*3/uL (ref 0.0–0.5)
EOS%: 5.1 % (ref 0.0–7.0)
HCT: 36.3 % (ref 34.8–46.6)
HEMOGLOBIN: 11.9 g/dL (ref 11.6–15.9)
LYMPH#: 2.7 10*3/uL (ref 0.9–3.3)
LYMPH%: 40.2 % (ref 14.0–48.0)
MCH: 30.7 pg (ref 26.0–34.0)
MCHC: 32.8 g/dL (ref 32.0–36.0)
MCV: 94 fL (ref 81–101)
MONO#: 0.9 10*3/uL (ref 0.1–0.9)
MONO%: 13.9 % — AB (ref 0.0–13.0)
NEUT%: 39.2 % — ABNORMAL LOW (ref 39.6–80.0)
NEUTROS ABS: 2.6 10*3/uL (ref 1.5–6.5)
Platelets: 133 10*3/uL — ABNORMAL LOW (ref 145–400)
RBC: 3.87 10*6/uL (ref 3.70–5.32)
RDW: 14.2 % (ref 11.1–15.7)
WBC: 6.7 10*3/uL (ref 3.9–10.0)

## 2015-09-11 MED ORDER — ROMIPLOSTIM 250 MCG ~~LOC~~ SOLR
1.0000 ug/kg | Freq: Once | SUBCUTANEOUS | Status: AC
  Administered 2015-09-11: 55 ug via SUBCUTANEOUS
  Filled 2015-09-11: qty 0.11

## 2015-09-11 MED ORDER — FAMOTIDINE 20 MG PO TABS
40.0000 mg | ORAL_TABLET | Freq: Once | ORAL | Status: AC
  Administered 2015-09-11: 40 mg via ORAL

## 2015-09-11 NOTE — Patient Instructions (Signed)
Romiplostim injection What is this medicine? ROMIPLOSTIM (roe mi PLOE stim) helps your body make more platelets. This medicine is used to treat low platelets caused by chronic idiopathic thrombocytopenic purpura (ITP). This medicine may be used for other purposes; ask your health care provider or pharmacist if you have questions. What should I tell my health care provider before I take this medicine? They need to know if you have any of these conditions: -cancer or myelodysplastic syndrome -low blood counts, like low white cell, platelet, or red cell counts -take medicines that treat or prevent blood clots -an unusual or allergic reaction to romiplostim, mannitol, other medicines, foods, dyes, or preservatives -pregnant or trying to get pregnant -breast-feeding How should I use this medicine? This medicine is for injection under the skin. It is given by a health care professional in a hospital or clinic setting. A special MedGuide will be given to you before your injection. Read this information carefully each time. Talk to your pediatrician regarding the use of this medicine in children. Special care may be needed. Overdosage: If you think you have taken too much of this medicine contact a poison control center or emergency room at once. NOTE: This medicine is only for you. Do not share this medicine with others. What if I miss a dose? It is important not to miss your dose. Call your doctor or health care professional if you are unable to keep an appointment. What may interact with this medicine? Interactions are not expected. This list may not describe all possible interactions. Give your health care provider a list of all the medicines, herbs, non-prescription drugs, or dietary supplements you use. Also tell them if you smoke, drink alcohol, or use illegal drugs. Some items may interact with your medicine. What should I watch for while using this medicine? Your condition will be monitored  carefully while you are receiving this medicine. Visit your prescriber or health care professional for regular checks on your progress and for the needed blood tests. It is important to keep all appointments. What side effects may I notice from receiving this medicine? Side effects that you should report to your doctor or health care professional as soon as possible: -allergic reactions like skin rash, itching or hives, swelling of the face, lips, or tongue -shortness of breath, chest pain, swelling in a leg -unusual bleeding or bruising Side effects that usually do not require medical attention (report to your doctor or health care professional if they continue or are bothersome): -dizziness -headache -muscle aches -pain in arms and legs -stomach pain -trouble sleeping This list may not describe all possible side effects. Call your doctor for medical advice about side effects. You may report side effects to FDA at 1-800-FDA-1088. Where should I keep my medicine? This drug is given in a hospital or clinic and will not be stored at home. NOTE: This sheet is a summary. It may not cover all possible information. If you have questions about this medicine, talk to your doctor, pharmacist, or health care provider.    2016, Elsevier/Gold Standard. (2007-10-08 15:13:04)  

## 2015-09-11 NOTE — Progress Notes (Signed)
  Hematology and Oncology Follow Up Visit  EMILIN MAPES [id number] 03-09-1975 40 y.o. 09/11/2015   Principle Diagnosis:   Mild immune thrombocytopenia  Patient to have hysterectomy on 7/24  Current Therapy:    Nplate in order to get platelet count above 80-90,000     Interim History:  Ms. Blitz is back for follow-up. We haven't given her Nplate. She is due for her hysterectomy on Monday. She's done well with the Nplate. She's had no problem with bleeding or bruising.  The last time that she got Nplate, she had some itching. I will try her on some Pepcid beforehand.  Otherwise, she's had no cough or shortness of breath. She's had no joint aches or pains. She's had no leg swelling.  Medications:  Current outpatient prescriptions:  .  ibuprofen (ADVIL,MOTRIN) 800 MG tablet, Take 1 tablet (800 mg total) by mouth every 8 (eight) hours as needed., Disp: 30 tablet, Rfl: 0 .  Multiple Vitamin (MULTIVITAMIN WITH MINERALS) TABS, Take 1 tablet by mouth daily. Uses [employer] brand., Disp: , Rfl:  .  oxyCODONE-acetaminophen (PERCOCET) 5-325 MG tablet, Take 1-2 tablets by mouth every 4 (four) hours as needed. use only as much as needed to relieve pain, Disp: 30 tablet, Rfl: 0 .  sertraline (ZOLOFT) 50 MG tablet, Take 50 mg by mouth daily., Disp: , Rfl:   Allergies:  Allergies  Allergen Reactions  . Other     IVIG-severe headache and neck stiffness    Past Medical History, Surgical history, Social history, and Family History were reviewed and updated.  Review of Systems: As above  Physical Exam:  height is 5\' 2"  (1.575 m) and weight is 123 lb (55.792 kg). Her temperature is 98.1 F (36.7 C). Her blood pressure is 123/75 and her pulse is 82. Her respiration is 16.   Wt Readings from Last 3 Encounters:  09/11/15 123 lb (55.792 kg)  09/04/15 119 lb 8 oz (54.205 kg)  09/01/15 120 lb 12.8 oz (54.795 kg)      Well-developed and well-nourished white female in no obvious distress. Head and  neck exam shows no ocular or oral lesions. She has no palpable cervical or supraclavicular lymph nodes. Lungs are clear. Cardiac exam regular rate and rhythm with a normal S1 and S2. There are no murmurs, rubs or bruits. Abdomen is soft. She has good bowel sounds. There is no fluid wave. There is no palpable liver or spleen tip. Extremities shows no clubbing, cyanosis or edema. Skin exam shows no rashes, ecchymoses or petechia. Neurological exam shows no focal neurological deficits.  Lab Results  Component Value Date   WBC 6.7 09/11/2015   HGB 11.9 09/11/2015   HCT 36.3 09/11/2015   MCV 94 09/11/2015   PLT 133 Large platelets present* 09/11/2015     Chemistry   No results found for: NA, K, CL, CO2, BUN, CREATININE, GLU No results found for: CALCIUM, ALKPHOS, AST, ALT, BILITOT       Impression and Plan: Ms. Petteway is  A 40 year old white female with immune thrombocytopenia. She is having her hysterectomy on Monday, July 24.  Her blood count today is fantastic. However, I do want to maintain her platelet count at a good level. I will give her a dose today.  I will plan to get her back to see me in 6 weeks. After that, I don't think we have to get her back unless she has any issues.   Volanda Napoleon, MD 7/21/201711:38 AM

## 2015-09-12 ENCOUNTER — Encounter (HOSPITAL_COMMUNITY): Payer: Self-pay | Admitting: Anesthesiology

## 2015-09-12 NOTE — Anesthesia Preprocedure Evaluation (Addendum)
Anesthesia Evaluation  Patient identified by MRN, date of birth, ID band Patient awake    Reviewed: Allergy & Precautions, NPO status , Patient's Chart, lab work & pertinent test results  Airway Mallampati: II  TM Distance: >3 FB Neck ROM: Full    Dental  (+) Teeth Intact   Pulmonary former smoker,    Pulmonary exam normal breath sounds clear to auscultation       Cardiovascular negative cardio ROS Normal cardiovascular exam+ Valvular Problems/Murmurs  Rhythm:Regular Rate:Normal     Neuro/Psych PSYCHIATRIC DISORDERS Anxiety negative neurological ROS     GI/Hepatic negative GI ROS, Neg liver ROS,   Endo/Other  negative endocrine ROS  Renal/GU negative Renal ROS  negative genitourinary   Musculoskeletal negative musculoskeletal ROS (+)   Abdominal Normal abdominal exam  (+)   Peds  Hematology  (+) anemia , Hx/o ITP S/P Splenectomy   Anesthesia Other Findings   Reproductive/Obstetrics Menorrhagia Pelvic pressure                             Anesthesia Physical Anesthesia Plan  ASA: II  Anesthesia Plan: General   Post-op Pain Management:    Induction: Intravenous  Airway Management Planned: Oral ETT  Additional Equipment:   Intra-op Plan:   Post-operative Plan: Extubation in OR  Informed Consent: I have reviewed the patients History and Physical, chart, labs and discussed the procedure including the risks, benefits and alternatives for the proposed anesthesia with the patient or authorized representative who has indicated his/her understanding and acceptance.   Dental advisory given  Plan Discussed with: Anesthesiologist, CRNA and Surgeon  Anesthesia Plan Comments:         Anesthesia Quick Evaluation

## 2015-09-13 MED ORDER — DEXTROSE 5 % IV SOLN
2.0000 g | INTRAVENOUS | Status: AC
  Administered 2015-09-14: 2 g via INTRAVENOUS
  Filled 2015-09-13: qty 2

## 2015-09-13 NOTE — H&P (Signed)
Mary Duffy is an 40 y.o. female G84P2A2 DWF here for hysterectomy due to menorrhagia.  She has failed oral contraceptives (both combination and progesterone only) as well as a Mirena IUD.  Pt has a very thin area near her cesarean section scar and I am concerned about risks for bladder injury with endometrial ablation.  Pt has, therefore, decided to proceed with hysterectomy.  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  She is aware that laparoscopic procedure will be attempted but there may be enough adhesions that open procedure is necessary.  She has been counseled about this as well as additional risks and is here and ready to proceed.  Pt has seen Dr. Burney Gauze prior to surgery and has been treated with Nplate for her platelet ct.  Her last level was 131.  She received one additional dosage after this level was obtained.  She has not been given Lovenox today.  Pertinent Gynecological History: Menses: regular and heavy Bleeding: menorrhagia Contraception: none DES exposure: denies Blood transfusions: none Sexually transmitted diseases: no past history Previous GYN Procedures: none  Last mammogram: normal Date: 7/16 Last pap: normal Date: 7/17 OB History: G4, P2   Menstrual History: Patient's last menstrual period was 08/22/2015 (exact date).    Past Medical History:  Diagnosis Date  . Abnormal Pap smear 1999   colpo 1999  . Anemia   . Anxiety   . Clotting disorder ([place])   . Cystic fibrosis carrier   . Fever blister   . H/O infertility   . IBS (irritable bowel syndrome)   . ITP (idiopathic thrombocytopenic purpura)    dx age 49, h/o splenectomy, nl value 80K  . Murmur    released from cardiologist as teenager    Past Surgical History:  Procedure Laterality Date  . CESAREAN SECTION  2002, 2004  . HYSTEROSCOPY WITH RESECTOSCOPE  7/13  . right ankle bone spurs  90's  . SPLENECTOMY  2001    Family History  Problem Relation Age  of Onset  . Heart disease Maternal Grandmother   . Hyperlipidemia Maternal Grandmother   . Breast cancer Maternal Grandmother 15  . Lung cancer Paternal Grandmother     lung  . Hypothyroidism Mother   . Hyperlipidemia Mother   . Hypertension Mother   . Migraines Mother   . Heart disease Father   . Other Daughter     conversion disorder    Social History:  reports that she quit smoking about 15 years ago. She quit after 10.00 years of use. She has never used smokeless tobacco. She reports that she drinks about 6.0 oz of alcohol per week . She reports that she does not use drugs.  Allergies:  Allergies  Allergen Reactions  . Other     IVIG-severe headache and neck stiffness    No prescriptions prior to admission.    Review of Systems  All other systems reviewed and are negative.   Last menstrual period 08/22/2015. Physical Exam  Constitutional: She appears well-developed and well-nourished.  Cardiovascular: Normal rate and regular rhythm.   Respiratory: Effort normal and breath sounds normal.  GI: Soft. Bowel sounds are normal.  Skin: Skin is warm and dry.  Psychiatric: She has a normal mood and affect.    No results found for this or any previous visit (from the past 24 hour(s)).  No results found.  Assessment/Plan: 40 yo G4P2 DWF here for definitive treatment of menorrhagia with failed Mirena  IUD use as well as failed oral medication.  H/O ITP, followed prior to surgery by Dr. Marin Olp.  H/O splenectomy and two prior cesarean sections.  All questions answered.  Pt here and ready to proceed.  Hale Bogus SUZANNE 09/13/2015, 9:20 PM

## 2015-09-14 ENCOUNTER — Ambulatory Visit (HOSPITAL_COMMUNITY): Payer: 59 | Admitting: Anesthesiology

## 2015-09-14 ENCOUNTER — Ambulatory Visit (HOSPITAL_COMMUNITY)
Admission: RE | Admit: 2015-09-14 | Discharge: 2015-09-15 | Disposition: A | Discharge status: Home / Self Care | Payer: 59 | Source: Ambulatory Visit | Attending: Obstetrics & Gynecology | Admitting: Obstetrics & Gynecology

## 2015-09-14 ENCOUNTER — Encounter (HOSPITAL_COMMUNITY): Payer: Self-pay

## 2015-09-14 ENCOUNTER — Encounter (HOSPITAL_COMMUNITY)
Admission: RE | Disposition: A | Discharge status: Home / Self Care | Payer: Self-pay | Source: Ambulatory Visit | Attending: Obstetrics & Gynecology

## 2015-09-14 DIAGNOSIS — N9489 Other specified conditions associated with female genital organs and menstrual cycle: Secondary | ICD-10-CM | POA: Diagnosis not present

## 2015-09-14 DIAGNOSIS — Z9081 Acquired absence of spleen: Secondary | ICD-10-CM | POA: Diagnosis not present

## 2015-09-14 DIAGNOSIS — N838 Other noninflammatory disorders of ovary, fallopian tube and broad ligament: Secondary | ICD-10-CM | POA: Insufficient documentation

## 2015-09-14 DIAGNOSIS — D693 Immune thrombocytopenic purpura: Secondary | ICD-10-CM | POA: Insufficient documentation

## 2015-09-14 DIAGNOSIS — D251 Intramural leiomyoma of uterus: Secondary | ICD-10-CM | POA: Diagnosis not present

## 2015-09-14 DIAGNOSIS — Z87891 Personal history of nicotine dependence: Secondary | ICD-10-CM | POA: Insufficient documentation

## 2015-09-14 DIAGNOSIS — K589 Irritable bowel syndrome without diarrhea: Secondary | ICD-10-CM | POA: Diagnosis not present

## 2015-09-14 DIAGNOSIS — N946 Dysmenorrhea, unspecified: Secondary | ICD-10-CM | POA: Diagnosis not present

## 2015-09-14 DIAGNOSIS — N920 Excessive and frequent menstruation with regular cycle: Secondary | ICD-10-CM | POA: Diagnosis not present

## 2015-09-14 HISTORY — PX: CYSTOSCOPY: SHX5120

## 2015-09-14 HISTORY — PX: LAPAROSCOPIC HYSTERECTOMY: SHX1926

## 2015-09-14 HISTORY — PX: LAPAROSCOPIC LYSIS OF ADHESIONS: SHX5905

## 2015-09-14 HISTORY — PX: BILATERAL SALPINGECTOMY: SHX5743

## 2015-09-14 LAB — PREGNANCY, URINE: PREG TEST UR: NEGATIVE

## 2015-09-14 SURGERY — HYSTERECTOMY, TOTAL, LAPAROSCOPIC
Anesthesia: General

## 2015-09-14 MED ORDER — METOCLOPRAMIDE HCL 5 MG/ML IJ SOLN
10.0000 mg | Freq: Once | INTRAMUSCULAR | Status: AC | PRN
  Administered 2015-09-14: 10 mg via INTRAVENOUS

## 2015-09-14 MED ORDER — ROPIVACAINE HCL 5 MG/ML IJ SOLN
INTRAMUSCULAR | Status: AC
  Filled 2015-09-14: qty 30

## 2015-09-14 MED ORDER — BUPIVACAINE HCL (PF) 0.25 % IJ SOLN
INTRAMUSCULAR | Status: DC | PRN
  Administered 2015-09-14: 18 mL

## 2015-09-14 MED ORDER — MIDAZOLAM HCL 5 MG/5ML IJ SOLN
INTRAMUSCULAR | Status: DC | PRN
  Administered 2015-09-14: 2 mg via INTRAVENOUS

## 2015-09-14 MED ORDER — SUGAMMADEX SODIUM 200 MG/2ML IV SOLN
INTRAVENOUS | Status: DC | PRN
  Administered 2015-09-14: 111.6 mg via INTRAVENOUS

## 2015-09-14 MED ORDER — ONDANSETRON HCL 4 MG/2ML IJ SOLN
INTRAMUSCULAR | Status: DC | PRN
  Administered 2015-09-14: 4 mg via INTRAVENOUS

## 2015-09-14 MED ORDER — ONDANSETRON HCL 4 MG/2ML IJ SOLN
4.0000 mg | INTRAMUSCULAR | Status: DC | PRN
  Administered 2015-09-14: 4 mg via INTRAVENOUS
  Filled 2015-09-14: qty 2

## 2015-09-14 MED ORDER — ACETAMINOPHEN 325 MG PO TABS: 650.0000 mg | ORAL_TABLET | ORAL | Status: DC | PRN

## 2015-09-14 MED ORDER — OXYCODONE-ACETAMINOPHEN 5-325 MG PO TABS
1.0000 | ORAL_TABLET | ORAL | Status: DC | PRN
  Administered 2015-09-14 – 2015-09-15 (×3): 1 via ORAL
  Filled 2015-09-14 (×3): qty 1

## 2015-09-14 MED ORDER — PROMETHAZINE HCL 25 MG/ML IJ SOLN
6.2500 mg | INTRAMUSCULAR | Status: DC | PRN
  Administered 2015-09-14: 6.25 mg via INTRAVENOUS

## 2015-09-14 MED ORDER — MEPERIDINE HCL 25 MG/ML IJ SOLN: 6.2500 mg | INTRAMUSCULAR | Status: DC | PRN

## 2015-09-14 MED ORDER — SIMETHICONE 80 MG PO CHEW
80.0000 mg | CHEWABLE_TABLET | Freq: Four times a day (QID) | ORAL | Status: DC | PRN
  Administered 2015-09-15: 80 mg via ORAL
  Filled 2015-09-14: qty 1

## 2015-09-14 MED ORDER — PROPOFOL 10 MG/ML IV BOLUS
INTRAVENOUS | Status: DC | PRN
  Administered 2015-09-14: 150 mg via INTRAVENOUS

## 2015-09-14 MED ORDER — FAMOTIDINE IN NACL 20-0.9 MG/50ML-% IV SOLN
20.0000 mg | Freq: Two times a day (BID) | INTRAVENOUS | Status: DC
  Administered 2015-09-14 (×2): 20 mg via INTRAVENOUS
  Filled 2015-09-14 (×3): qty 50

## 2015-09-14 MED ORDER — SERTRALINE HCL 50 MG PO TABS
50.0000 mg | ORAL_TABLET | Freq: Every day | ORAL | Status: DC
  Filled 2015-09-14: qty 1

## 2015-09-14 MED ORDER — ALUM & MAG HYDROXIDE-SIMETH 200-200-20 MG/5ML PO SUSP: 30.0000 mL | ORAL | Status: DC | PRN

## 2015-09-14 MED ORDER — SCOPOLAMINE 1 MG/3DAYS TD PT72
1.0000 | MEDICATED_PATCH | Freq: Once | TRANSDERMAL | Status: DC
  Administered 2015-09-14: 1.5 mg via TRANSDERMAL

## 2015-09-14 MED ORDER — SODIUM CHLORIDE 0.9 % IJ SOLN
INTRAMUSCULAR | Status: DC | PRN
  Administered 2015-09-14: 10 mL

## 2015-09-14 MED ORDER — SCOPOLAMINE 1 MG/3DAYS TD PT72
MEDICATED_PATCH | TRANSDERMAL | Status: AC
  Filled 2015-09-14: qty 1

## 2015-09-14 MED ORDER — ROCURONIUM BROMIDE 100 MG/10ML IV SOLN
INTRAVENOUS | Status: DC | PRN
  Administered 2015-09-14: 5 mg via INTRAVENOUS
  Administered 2015-09-14 (×2): 10 mg via INTRAVENOUS
  Administered 2015-09-14: 35 mg via INTRAVENOUS
  Administered 2015-09-14: 10 mg via INTRAVENOUS

## 2015-09-14 MED ORDER — ONDANSETRON HCL 4 MG/2ML IJ SOLN
INTRAMUSCULAR | Status: AC
  Filled 2015-09-14: qty 2

## 2015-09-14 MED ORDER — DEXTROSE-NACL 5-0.45 % IV SOLN
INTRAVENOUS | Status: DC
  Administered 2015-09-14: 15:00:00 via INTRAVENOUS

## 2015-09-14 MED ORDER — FENTANYL CITRATE (PF) 250 MCG/5ML IJ SOLN
INTRAMUSCULAR | Status: AC
  Filled 2015-09-14: qty 5

## 2015-09-14 MED ORDER — METOCLOPRAMIDE HCL 5 MG/ML IJ SOLN
INTRAMUSCULAR | Status: AC
  Filled 2015-09-14: qty 2

## 2015-09-14 MED ORDER — KETOROLAC TROMETHAMINE 30 MG/ML IJ SOLN
INTRAMUSCULAR | Status: AC
  Filled 2015-09-14: qty 1

## 2015-09-14 MED ORDER — SODIUM CHLORIDE 0.9 % IV SOLN
INTRAVENOUS | Status: DC | PRN
  Administered 2015-09-14: 60 mL

## 2015-09-14 MED ORDER — LIDOCAINE HCL (PF) 1 % IJ SOLN
INTRAMUSCULAR | Status: AC
  Filled 2015-09-14: qty 5

## 2015-09-14 MED ORDER — PHENYLEPHRINE 40 MCG/ML (10ML) SYRINGE FOR IV PUSH (FOR BLOOD PRESSURE SUPPORT)
PREFILLED_SYRINGE | INTRAVENOUS | Status: AC
  Filled 2015-09-14: qty 10

## 2015-09-14 MED ORDER — PROPOFOL 10 MG/ML IV BOLUS
INTRAVENOUS | Status: AC
  Filled 2015-09-14: qty 20

## 2015-09-14 MED ORDER — MENTHOL 3 MG MT LOZG: 1.0000 | LOZENGE | OROMUCOSAL | Status: DC | PRN

## 2015-09-14 MED ORDER — SODIUM CHLORIDE 0.9 % IJ SOLN
INTRAMUSCULAR | Status: AC
  Filled 2015-09-14: qty 50

## 2015-09-14 MED ORDER — LIDOCAINE HCL (CARDIAC) 20 MG/ML IV SOLN
INTRAVENOUS | Status: DC | PRN
  Administered 2015-09-14: 50 mg via INTRAVENOUS

## 2015-09-14 MED ORDER — PROMETHAZINE HCL 25 MG/ML IJ SOLN
INTRAMUSCULAR | Status: AC
  Administered 2015-09-14: 6.25 mg via INTRAVENOUS
  Filled 2015-09-14: qty 1

## 2015-09-14 MED ORDER — LACTATED RINGERS IR SOLN
Status: DC | PRN
  Administered 2015-09-14: 3000 mL

## 2015-09-14 MED ORDER — STERILE WATER FOR IRRIGATION IR SOLN
Status: DC | PRN
  Administered 2015-09-14: 1000 mL

## 2015-09-14 MED ORDER — PHENYLEPHRINE HCL 10 MG/ML IJ SOLN
INTRAMUSCULAR | Status: DC | PRN
  Administered 2015-09-14 (×2): 40 ug via INTRAVENOUS

## 2015-09-14 MED ORDER — DEXAMETHASONE SODIUM PHOSPHATE 4 MG/ML IJ SOLN
INTRAMUSCULAR | Status: DC | PRN
  Administered 2015-09-14: 4 mg via INTRAVENOUS

## 2015-09-14 MED ORDER — MORPHINE SULFATE (PF) 4 MG/ML IV SOLN
1.0000 mg | INTRAVENOUS | Status: DC | PRN
  Administered 2015-09-14: 2 mg via INTRAVENOUS
  Filled 2015-09-14: qty 1

## 2015-09-14 MED ORDER — DEXAMETHASONE SODIUM PHOSPHATE 4 MG/ML IJ SOLN
INTRAMUSCULAR | Status: AC
  Filled 2015-09-14: qty 1

## 2015-09-14 MED ORDER — BUPIVACAINE HCL (PF) 0.25 % IJ SOLN
INTRAMUSCULAR | Status: AC
  Filled 2015-09-14: qty 30

## 2015-09-14 MED ORDER — LACTATED RINGERS IV SOLN
INTRAVENOUS | Status: DC
  Administered 2015-09-14 (×2): via INTRAVENOUS
  Administered 2015-09-14: 125 mL/h via INTRAVENOUS

## 2015-09-14 MED ORDER — MIDAZOLAM HCL 2 MG/2ML IJ SOLN
INTRAMUSCULAR | Status: AC
  Filled 2015-09-14: qty 2

## 2015-09-14 MED ORDER — FENTANYL CITRATE (PF) 100 MCG/2ML IJ SOLN
INTRAMUSCULAR | Status: DC | PRN
  Administered 2015-09-14: 25 ug via INTRAVENOUS
  Administered 2015-09-14: 100 ug via INTRAVENOUS
  Administered 2015-09-14: 50 ug via INTRAVENOUS
  Administered 2015-09-14: 25 ug via INTRAVENOUS
  Administered 2015-09-14: 50 ug via INTRAVENOUS

## 2015-09-14 MED ORDER — HYDROMORPHONE HCL 1 MG/ML IJ SOLN: 0.2500 mg | INTRAMUSCULAR | Status: DC | PRN

## 2015-09-14 SURGICAL SUPPLY — 47 items
APPLICATOR ARISTA FLEXITIP XL (MISCELLANEOUS) ×3 IMPLANT
BENZOIN TINCTURE PRP APPL 2/3 (GAUZE/BANDAGES/DRESSINGS) IMPLANT
CABLE HIGH FREQUENCY MONO STRZ (ELECTRODE) IMPLANT
CLOTH BEACON ORANGE TIMEOUT ST (SAFETY) ×3 IMPLANT
COVER BACK TABLE 60X90IN (DRAPES) ×3 IMPLANT
COVER LIGHT HANDLE  1/PK (MISCELLANEOUS) ×1
COVER LIGHT HANDLE 1/PK (MISCELLANEOUS) ×2 IMPLANT
DISSECTOR BLUNT TIP ENDO 5MM (MISCELLANEOUS) IMPLANT
DURAPREP 26ML APPLICATOR (WOUND CARE) ×3 IMPLANT
GLOVE BIOGEL PI IND STRL 7.0 (GLOVE) ×10 IMPLANT
GLOVE BIOGEL PI INDICATOR 7.0 (GLOVE) ×5
GLOVE ECLIPSE 6.5 STRL STRAW (GLOVE) ×9 IMPLANT
GOWN STRL REUS W/TWL LRG LVL3 (GOWN DISPOSABLE) ×12 IMPLANT
HEMOSTAT ARISTA ABSORB 3G PWDR (MISCELLANEOUS) ×3 IMPLANT
LIGASURE BLUNT 5MM 37CM (INSTRUMENTS) ×3 IMPLANT
LIQUID BAND (GAUZE/BANDAGES/DRESSINGS) ×3 IMPLANT
NEEDLE INSUFFLATION 120MM (ENDOMECHANICALS) IMPLANT
NS IRRIG 1000ML POUR BTL (IV SOLUTION) ×3 IMPLANT
OCCLUDER COLPOPNEUMO (BALLOONS) ×3 IMPLANT
PACK LAPAROSCOPY BASIN (CUSTOM PROCEDURE TRAY) ×3 IMPLANT
PAD TRENDELENBURG POSITION (MISCELLANEOUS) ×3 IMPLANT
POUCH LAPAROSCOPIC INSTRUMENT (MISCELLANEOUS) ×3 IMPLANT
SCISSORS LAP 5X35 DISP (ENDOMECHANICALS) ×3 IMPLANT
SET CYSTO W/LG BORE CLAMP LF (SET/KITS/TRAYS/PACK) ×3 IMPLANT
SET IRRIG TUBING LAPAROSCOPIC (IRRIGATION / IRRIGATOR) ×3 IMPLANT
SET TRI-LUMEN FLTR TB AIRSEAL (TUBING) ×3 IMPLANT
SHEARS HARMONIC ACE PLUS 36CM (ENDOMECHANICALS) ×3 IMPLANT
SLEEVE ADV FIXATION 5X100MM (TROCAR) ×3 IMPLANT
SOLUTION ELECTROLUBE (MISCELLANEOUS) ×3 IMPLANT
SUT VIC AB 0 CT1 27 (SUTURE) ×2
SUT VIC AB 0 CT1 27XBRD ANBCTR (SUTURE) ×4 IMPLANT
SUT VICRYL 0 UR6 27IN ABS (SUTURE) ×3 IMPLANT
SUT VICRYL 4-0 PS2 18IN ABS (SUTURE) ×3 IMPLANT
SUT VLOC 180 0 9IN  GS21 (SUTURE) ×1
SUT VLOC 180 0 9IN GS21 (SUTURE) ×2 IMPLANT
SYR 50ML LL SCALE MARK (SYRINGE) ×6 IMPLANT
SYR BULB IRRIGATION 50ML (SYRINGE) ×3 IMPLANT
TIP UTERINE 5.1X6CM LAV DISP (MISCELLANEOUS) IMPLANT
TIP UTERINE 6.7X10CM GRN DISP (MISCELLANEOUS) IMPLANT
TIP UTERINE 6.7X6CM WHT DISP (MISCELLANEOUS) IMPLANT
TIP UTERINE 6.7X8CM BLUE DISP (MISCELLANEOUS) ×3 IMPLANT
TOWEL OR 17X24 6PK STRL BLUE (TOWEL DISPOSABLE) ×6 IMPLANT
TRAY FOLEY CATH SILVER 14FR (SET/KITS/TRAYS/PACK) ×3 IMPLANT
TROCAR ADV FIXATION 5X100MM (TROCAR) ×3 IMPLANT
TROCAR PORT AIRSEAL 8X120 (TROCAR) ×3 IMPLANT
TROCAR XCEL NON-BLD 5MMX100MML (ENDOMECHANICALS) ×3 IMPLANT
WARMER LAPAROSCOPE (MISCELLANEOUS) ×3 IMPLANT

## 2015-09-14 NOTE — Anesthesia Procedure Notes (Signed)
Procedure Name: Intubation Date/Time: 09/14/2015 7:27 AM Performed by: Bufford Spikes Pre-anesthesia Checklist: Patient identified, Emergency Drugs available, Suction available, Patient being monitored and Timeout performed Patient Re-evaluated:Patient Re-evaluated prior to inductionOxygen Delivery Method: Circle system utilized Preoxygenation: Pre-oxygenation with 100% oxygen Intubation Type: IV induction Ventilation: Mask ventilation without difficulty Laryngoscope Size: Miller and 2 Grade View: Grade I Tube type: Oral Tube size: 7.0 mm Number of attempts: 1 Airway Equipment and Method: Stylet Placement Confirmation: ETT inserted through vocal cords under direct vision,  positive ETCO2 and breath sounds checked- equal and bilateral Secured at: 20 cm Tube secured with: Tape Dental Injury: Teeth and Oropharynx as per pre-operative assessment

## 2015-09-14 NOTE — Anesthesia Procedure Notes (Signed)
Date/Time: 09/14/2015 7:38 AM Performed by: Bufford Spikes

## 2015-09-14 NOTE — Transfer of Care (Signed)
Immediate Anesthesia Transfer of Care Note  Patient: Mary Duffy  Procedure(s) Performed: Procedure(s): HYSTERECTOMY TOTAL LAPAROSCOPIC (N/A) BILATERAL SALPINGECTOMY (Bilateral) CYSTOSCOPY (N/A) LAPAROSCOPIC LYSIS OF ADHESIONS (N/A)  Patient Location: PACU  Anesthesia Type:General  Level of Consciousness: awake, alert  and oriented  Airway & Oxygen Therapy: Patient connected to nasal cannula oxygen  Post-op Assessment: Post -op Vital signs reviewed and stable  Post vital signs: Reviewed and stable  Last Vitals:  Vitals:   09/14/15 0611  BP: 109/77  Pulse: 90  Resp: 16  Temp: 36.9 C    Last Pain:  Vitals:   09/14/15 0611  TempSrc: Oral  PainSc: 0-No pain      Patients Stated Pain Goal: 5 ([id number])  Complications: No apparent anesthesia complications

## 2015-09-14 NOTE — Op Note (Signed)
09/14/2015  10:31 AM  PATIENT:  Mary Duffy  40 y.o. female  PRE-OPERATIVE DIAGNOSIS:  Menorrhagia, pelvic pressure, ITP, h/o splenectomy, h/o cesarean section x 2  POST-OPERATIVE DIAGNOSIS:  Above as well as omental adhesions to anterior abdominal wall and adhesions of bladder to lower uterine segment from prior cesarean sections  PROCEDURE:  Procedure(s): HYSTERECTOMY TOTAL LAPAROSCOPIC BILATERAL SALPINGECTOMY CYSTOSCOPY LAPAROSCOPIC LYSIS OF ADHESIONS  SURGEON:  Reese Stockman SUZANNE  ASSISTANTS: Josefa Half   ANESTHESIA:   general  ESTIMATED BLOOD LOSS: 75cc  BLOOD ADMINISTERED:none   FLUIDS: 1100cc LR  UOP: 600cc clear UOP  SPECIMEN:  Uterus and cervix, bilateral fallopian tubes  DISPOSITION OF SPECIMEN:  PATHOLOGY  FINDINGS: omental adhesions to anterior abdominal wall, bladder flap adhesions, minimal adhesions in LUQ from prior splenectomy  DESCRIPTION OF OPERATION: Patient is taken to the operating room. She is placed in the supine position. She is a running IV in place. Informed consent was present on the chart. SCDs on her lower extremities and functioning properly. General endotracheal anesthesia was administered by the anesthesia staff without difficulty. Dr. Royce Macadamia oversaw case. Once adequate anesthesia was confirmed the legs are placed in the low lithotomy position in [place]. Her arms were tucked by the side.   Dura prep was then used to prep the abdomen and Betadine was used to prep the inner thighs, perineum and vagina. Once 3 minutes had past the patient was draped in a normal standard fashion. The legs were lifted to the high lithotomy position. Time out was performed.  Then the cervix was visualized by placing a heavy weighted speculum in the posterior aspect of the vagina and using a curved Deaver retractor to the retract anteriorly. The anterior lip of the cervix was grasped with single-tooth tenaculum.  The cervix sounded to 8 cm. Pratt dilators  were used to dilate the cervix up to a #21. A RUMI uterine manipulator was obtained. A #8 disposable tip was placed on the RUMI manipulator as well as a small, silver KOH ring. This was passed through the cervix and the bulb of the disposable tip was inflated with 10 cc of normal saline. There was a good fit of the KOH ring around the cervix. The tenaculum was removed. There is also good manipulation of the uterus. The speculum and retractor were removed as well. A Foley catheter was placed to straight drain. The concentrated urine was noted. Legs were lowered to the low lithotomy position and attention was turned the abdomen.  The umbilicus was everted.  A Veress needle was obtained. Syringe of sterile saline was placed on a open Veress needle.  This was passed into the umbilicus until just when the fluid started to drip.  Then low flow CO2 gas was attached the needle and the pneumoperitoneum was achieved without difficulty. Once four liters of gas was in the abdomen the Veress needle was removed and a 5 millimeter non-bladed Optiview trocar and port were passed directly to the abdomen. The laparoscope was then used to confirm intraperitoneal placement. Above findings noted.  Pt was placed in trendelenburg positioning.  Locations for RLQ and LLQ ports were noted by transillumination of the abdominal wall.  0.25% marcaine was used to anesthetize the skin.  72mm skin incisions were made and then 14mm bladed ports were placed.  Because of the omental adhesions, the suprapubic port could not be placed.  Adhesions were taken down carefully with the harmonic scalpel.  These were just omental adhesions and there was  no bowel present in the adhesions.  Finally a midline incision was made about 4 cm above the pubic symphysis.  The skin was incised about 1cm and a non-bladed 8 enseal port was placed with direct visualization of the laparoscope.    Ureters were identifies.  Attention was turned to the left side. With uterus  on stretch the left tube was excised off the ovary and mesosalpinx was dissected to free the tube. Then the left utero-ovarian pedicle was serially clamped cauterized and incised using the ligasure device. Left round ligament was serially clamped cauterized and incised. The anterior and posterior peritoneum of the inferior leaf of the broad ligament were opened. The beginning of the baldde flap was created.  There was significant adhesions present so this was done with sharp dissection and also with the harmonic scalpel.  The bladder was not down far enough to get the uterine artery on this side, so attention was turned to the right side.    Attention was turned the right side.  The uterus was placed on stretch to the opposite side.  The tube was excised off the ovary using sharp dissection a bipolar cautery.  The mesosalpinx was incised freeing the tube. Then the right uterine ovarian pedicle was serially clamped cauterized and incised. Next the right round ligament was serially clamped cauterized and incised. The anterior posterior peritoneum of the inferiorly for the broad ligament were opened. The anterior peritoneum was carried across to the dissection on the left side.  The bladder flap dissection was much easier on this side.  Once this was completed and the bladder on the right side was well below the level of the KOH ring, the right uterine artery was skeletonized. Then the right uterine artery, above the level of the KOH ring, was serially clamped cauterized and incised.   Attention was turned back to the left side and the remainder of the bladder flap dissection was completed.  The left uterine artery skeletonized and then just above the level of the KOH ring, this vessel was serially clamped, cauterized, and incised.  The uterus was devascularized at this point.  The colpotomy was performed a starting in the midline and using a harmonic scalpel with the inferior edge of the open blade  This was  carried around a circumferential fashion until the vaginal mucosa was completely incised in the specimen was freed.  The specimen was then delivered to the vagina.  A vaginal occlusive device was used to maintain the pneumoperitoneum  Instruments were changed with a needle driver and million dollar graspers.  Using a 9 inch V. lock suture, the cuff was closed by incorporating the anterior and posterior vaginal mucosa in each stitch. This was carried across all the way to the left corner and a running fashion. Two stitches were brought back towards the midline and the suture was cut flush with the vagina. The needle was brought out the pelvis. The pelvis was irrigated. All pedicles were inspected. No bleeding was noted.  Arista was placed across the cuff and along the pedicles due to hx of ITP.  No bleeding was ntoed.  Pt was taken out of trendelenberg positioning.  The RLQ and LLQ ports were removed under direct visualization.  The pneumoperitoneum was relieved.  The lower midline port and umbilical ports were removed.  The midline lower incision was closed at the fascial layer.    The skin was then closed with subcuticular stitches of 3-0 Vicryl. The skin was cleansed Dermabond  was applied. Attention was then turned the vagina and the cuff was inspected. No bleeding was noted. The anterior posterior vaginal mucosa was incorporated in each stitch. The Foley catheter was removed.  Cystoscopy was performed.  No sutures or bladder injuries were noted.  Ureters were noted with normal urine jets from each one was seen. Cystoscopic fluid was drained.  Foley was left out.  Sponge, lap, needle, initially counts were correct x2. Patient tolerated the procedure very well. She was awakened from anesthesia, extubated and taken to recovery in stable condition.   COUNTS:  YES  PLAN OF CARE: Transfer to PACU

## 2015-09-14 NOTE — Addendum Note (Signed)
Addendum  created 09/14/15 1529 by Raenette Rover, CRNA   Sign clinical note

## 2015-09-14 NOTE — Anesthesia Postprocedure Evaluation (Signed)
Anesthesia Post Note  Patient: BITTANY PIERSMA  Procedure(s) Performed: Procedure(s) (LRB): HYSTERECTOMY TOTAL LAPAROSCOPIC (N/A) BILATERAL SALPINGECTOMY (Bilateral) CYSTOSCOPY (N/A) LAPAROSCOPIC LYSIS OF ADHESIONS (N/A)  Patient location during evaluation: PACU Anesthesia Type: General Level of consciousness: awake and alert Pain management: pain level controlled Vital Signs Assessment: post-procedure vital signs reviewed and stable Respiratory status: spontaneous breathing, nonlabored ventilation and respiratory function stable Cardiovascular status: blood pressure returned to baseline and stable Postop Assessment: no signs of nausea or vomiting Anesthetic complications: no              Amiel Mccaffrey A.

## 2015-09-14 NOTE — Anesthesia Postprocedure Evaluation (Signed)
Anesthesia Post Note  Patient: Mary Duffy  Procedure(s) Performed: Procedure(s) (LRB): HYSTERECTOMY TOTAL LAPAROSCOPIC (N/A) BILATERAL SALPINGECTOMY (Bilateral) CYSTOSCOPY (N/A) LAPAROSCOPIC LYSIS OF ADHESIONS (N/A)  Patient location during evaluation: [facility] Anesthesia Type: General Level of consciousness: awake, awake and alert, oriented and patient cooperative Pain management: pain level controlled Vital Signs Assessment: post-procedure vital signs reviewed and stable Respiratory status: spontaneous breathing, nonlabored ventilation and respiratory function stable Cardiovascular status: stable Postop Assessment: no headache, no backache, no signs of nausea or vomiting and patient able to bend at knees Anesthetic complications: no     Last Vitals:  Vitals:   09/14/15 1227 09/14/15 1328  BP:  (!) 106/53  Pulse: 93 85  Resp: 16 20  Temp: 37.2 C 37.3 C    Last Pain:  Vitals:   09/14/15 1330  TempSrc:   PainSc: 4    Pain Goal: Patients Stated Pain Goal: 4 (09/14/15 1230)               Jozie Wulf L

## 2015-09-14 NOTE — Progress Notes (Signed)
Day of Surgery Procedure(s) (LRB): HYSTERECTOMY TOTAL LAPAROSCOPIC (N/A) BILATERAL SALPINGECTOMY (Bilateral) CYSTOSCOPY (N/A) LAPAROSCOPIC LYSIS OF ADHESIONS (N/A)  Subjective: Patient reports nausea has improved.  Is having moderate pain right now but hasn't gotten anything for pain since PACU.  No emesis.  No bleeding.  She has voided twice.  Objective: Vitals:   09/14/15 1227 09/14/15 1230 09/14/15 1328 09/14/15 1702  BP:   (!) 106/53 107/65  Pulse: 93  85 93  Resp: 16  20 16   Temp: 99 F (37.2 C)  99.2 F (37.3 C) 98 F (36.7 C)  TempSrc: Oral  Oral Oral  SpO2: 100%  100% 100%  Weight:  123 lb (55.8 kg)    Height:  5\' 2"  (1.575 m)      I have reviewed patient's vital signs, intake and output, medications and labs.  General: alert and cooperative Resp: clear to auscultation bilaterally Cardio: regular rate and rhythm, S1, S2 normal, no murmur, click, rub or gallop GI: soft, mildly tender, decreased BS, non distended Extremities: extremities normal, atraumatic, no cyanosis or edema Vaginal Bleeding: none  Assessment: s/p Procedure(s): HYSTERECTOMY TOTAL LAPAROSCOPIC (N/A) BILATERAL SALPINGECTOMY (Bilateral) CYSTOSCOPY (N/A) LAPAROSCOPIC LYSIS OF ADHESIONS (N/A): stable  Plan: Encourage ambulation  Advance to po pain medications with improved BS Repeat CBC in am Continue SCDs  LOS: 0 days    Lyman Speller 09/14/2015, 5:45 PM

## 2015-09-15 DIAGNOSIS — N920 Excessive and frequent menstruation with regular cycle: Secondary | ICD-10-CM | POA: Diagnosis not present

## 2015-09-15 LAB — CBC
HEMATOCRIT: 30.7 % — AB (ref 36.0–46.0)
HEMOGLOBIN: 9.9 g/dL — AB (ref 12.0–15.0)
MCH: 29.6 pg (ref 26.0–34.0)
MCHC: 32.2 g/dL (ref 30.0–36.0)
MCV: 91.9 fL (ref 78.0–100.0)
Platelets: 120 10*3/uL — ABNORMAL LOW (ref 150–400)
RBC: 3.34 MIL/uL — AB (ref 3.87–5.11)
RDW: 14.9 % (ref 11.5–15.5)
WBC: 13 10*3/uL — ABNORMAL HIGH (ref 4.0–10.5)

## 2015-09-15 NOTE — Discharge Instructions (Signed)
Post Op Hysterectomy Instructions Please read the instructions below. Refer to these instructions for the next few weeks. These instructions provide you with general information on caring for yourself after surgery. Your caregiver may also give you specific instructions. While your treatment has been planned according to the most current medical practices available, unavoidable problems sometimes happen. If you have any problems or questions after you leave, please call your caregiver.  HOME CARE INSTRUCTIONS Healing will take time. You will have discomfort, tenderness, swelling and bruising at the operative site for a couple of weeks. This is normal and will get better as time goes on.   Only take over-the-counter or prescription medicines for pain, discomfort or fever as directed by your caregiver.   Do not take aspirin. It can cause bleeding.   Do not drive when taking pain medication.   Follow your caregivers advice regarding diet, exercise, lifting, driving and general activities.   Resume your usual diet as directed and allowed.   Get plenty of rest and sleep.   Do not douche, use tampons, or have sexual intercourse until your caregiver gives you permission. .   Take your temperature if you feel hot or flushed.   You may shower today when you get home.  No tub bath for one week.    Do not drink alcohol until you are not taking any narcotic pain medications.   Try to have someone home with you for a week or two to help with the household activities.   Be careful over the next two to three weeks with any activities at home that involve lifting, pushing, or pulling.  Listen to your body--if something feels uncomfortable to do, then don't do it.  Make sure you and your family understands everything about your operation and recovery.   Walking up stairs is fine.  Do not sign any legal documents until you feel normal again.   Keep all your follow-up appointments as recommended by  your caregiver.   Take off your dressings on Thursday morning.  Take the scopolamine patch off from behind your ear on Thursday morning as well.  Make sure to wash your hands after this has been removed.  PLEASE CALL THE OFFICE IF:  There is swelling, redness or increasing pain in the wound area.   Pus is coming from the wound.   You notice a bad smell from the wound or surgical dressing.   You have pain, redness and swelling from the intravenous site.   The wound is breaking open (the edges are not staying together).    You develop pain or bleeding when you urinate.   You develop abnormal vaginal discharge.   You have any type of abnormal reaction or develop an allergy to your medication.   You need stronger pain medication for your pain   SEEK IMMEDIATE MEDICAL CARE:  You develop a temperature of 100.5 or higher.   You develop abdominal pain.   You develop chest pain.   You develop shortness of breath.   You pass out.   You develop pain, swelling or redness of your leg.   You develop heavy vaginal bleeding with or without blood clots.   MEDICATIONS:  Restart your regular medications BUT wait one week before restarting all vitamins and mineral supplements  Use Motrin 800mg  every 8 hours for the next several days.  This will help you use less Percocet.  Use the Percocet 5/325 1-2 tabs every 4-6 hours as needed for pain.  You may use an over the counter stool softener like Colace or Dulcolax to help with starting a bowel movement.  Start the day you go home.  You can take Colace twice daily up to two capsules at eat time (four total in a day). Warm liquids, fluids, and ambulation help too.  If you have not had a bowel movement in four days, you need to call the office.

## 2015-09-15 NOTE — Discharge Summary (Signed)
Physician Discharge Summary  Patient ID: BETTE SWATZELL MRN: [id number] DOB/AGE: Nov 21, 1975 40 y.o.  Admit date: 09/14/2015 Discharge date: 09/15/2015  Admission Diagnoses: menorrhagia, dysmenorrhea, pelvic pressure in female, ITP  Discharge Diagnoses:  Active Problems:   * No active hospital problems. *  Discharged Condition: good  Hospital Course: Patient admitted through same day surgery.  She was taken to OR where TLH/bilateral salpingectomy/cystoscopy were performed.  Surgical findings included omental adhesions to anterior abdominal wall, LUQ adhesions from prior splenectomy, and bladder flap adhesions from two prior cesarean sections.  Surgery was uneventful.  EBL 75cc.  Foley catheter was removed before leaving OR.  Patient transferred to PACU where she was stable and then to 3rd floor for the remainder of her hospitalization.  During her post-op recovery, her vitals and stable and she was AF.  In evening of POD#0, she was able to transition to oral pain medications and regular diet.  She was able to ambulate and she had good pain control.  She was also able to void on her own.  Patient seen both in the evening of POD#0 and AM of POD#1.  In the AM of POD#1, she was without complaint.  Post op hb was 9.9, decreased from 11.9, pre-operatively.  At this point, patient was voiding, walking, having excellent pain control, had no nausea, and minimal vaginal bleeding.  She was ready for D/C.  Consults: None  Significant Diagnostic Studies: labs: post op hb 9.9  Treatments: surgery: TLH/bilateral salpingectomy/cystoscopy  Discharge Exam: Blood pressure (!) 95/58, pulse 91, temperature 99 F (37.2 C), temperature source Oral, resp. rate 16, height 5\' 2"  (1.575 m), weight 123 lb (55.8 kg), last menstrual period 08/22/2015, SpO2 99 %. General appearance: alert and cooperative Resp: clear to auscultation bilaterally Cardio: regular rate and rhythm, S1, S2 normal, no murmur, click, rub or  gallop GI: soft, non-tender; bowel sounds normal; no masses,  no organomegaly Extremities: extremities normal, atraumatic, no cyanosis or edema Incision/Wound:c/d/i  Disposition: 01-Home or Self Care     Medication List    TAKE these medications   ibuprofen 800 MG tablet Commonly known as:  ADVIL,MOTRIN Take 1 tablet (800 mg total) by mouth every 8 (eight) hours as needed.   multivitamin with minerals Tabs tablet Take 1 tablet by mouth daily. Uses [employer] brand.   oxyCODONE-acetaminophen 5-325 MG tablet Commonly known as:  PERCOCET Take 1-2 tablets by mouth every 4 (four) hours as needed. use only as much as needed to relieve pain   sertraline 50 MG tablet Commonly known as:  ZOLOFT Take 50 mg by mouth daily.      Follow-up Information    Lyman Speller, MD Follow up on 09/21/2015.   Specialty:  Gynecology Why:  appt time 12:45pm Contact information: [address] [place] [address] [phone]           Signed: Lyman Speller 09/15/2015, 8:26 AM

## 2015-09-15 NOTE — Progress Notes (Signed)
Pt   Ambulated out teaching complete  ambulated out  With family and nurse

## 2015-09-15 NOTE — Progress Notes (Signed)
1 Day Post-Op Procedure(s) (LRB): HYSTERECTOMY TOTAL LAPAROSCOPIC (N/A) BILATERAL SALPINGECTOMY (Bilateral) CYSTOSCOPY (N/A) LAPAROSCOPIC LYSIS OF ADHESIONS (N/A)  Subjective: Patient reports good pain control.  Some right shoulder pain.  No nausea.  Eating well.  Voiding well.  Walking without problems.  Has passed flatus.  Objective: I have reviewed patient's vital signs, intake and output, medications and labs. Vitals:   09/14/15 1702 09/14/15 2227 09/15/15 0138 09/15/15 0531  BP: 107/65 (!) 101/56 (!) 96/54 (!) 95/58  Pulse: 93 88 87 91  Resp: 16 16 16 16   Temp: 98 F (36.7 C) 98.7 F (37.1 C) 99.1 F (37.3 C) 99 F (37.2 C)  TempSrc: Oral Oral Oral Oral  SpO2: 100% 99% 95% 99%  Weight:      Height:         General: alert and cooperative Resp: clear to auscultation bilaterally Cardio: regular rate and rhythm, S1, S2 normal, no murmur, click, rub or gallop GI: soft, non-tender; bowel sounds normal; no masses,  no organomegaly and incision: clean, dry and intact Extremities: extremities normal, atraumatic, no cyanosis or edema Vaginal Bleeding: none  Assessment: s/p Procedure(s): HYSTERECTOMY TOTAL LAPAROSCOPIC (N/A) BILATERAL SALPINGECTOMY (Bilateral) CYSTOSCOPY (N/A) LAPAROSCOPIC LYSIS OF ADHESIONS (N/A): stable and progressing well  Plan: Discharge home  LOS: 0 days    Hale Bogus SUZANNE 09/15/2015, 8:23 AM

## 2015-09-21 ENCOUNTER — Ambulatory Visit (INDEPENDENT_AMBULATORY_CARE_PROVIDER_SITE_OTHER): Payer: 59 | Admitting: Obstetrics & Gynecology

## 2015-09-21 ENCOUNTER — Ambulatory Visit: Payer: 59 | Admitting: Obstetrics & Gynecology

## 2015-09-21 ENCOUNTER — Encounter: Payer: Self-pay | Admitting: Obstetrics & Gynecology

## 2015-09-21 VITALS — BP 104/70 | HR 78 | Resp 16 | Ht 62.0 in | Wt 119.0 lb

## 2015-09-21 DIAGNOSIS — Z9889 Other specified postprocedural states: Secondary | ICD-10-CM

## 2015-09-21 NOTE — Progress Notes (Signed)
Post Operative Visit   Procedure: TLH/bilateral salpingectomy/cystoscopy Days Post-op: 7  Subjective: Doing well.  Off pain medication completely.  Biggest complaint is fatigue.  Normal BMs and normal urinary function.  No vaginal bleeding.  Working from home this week.  Pathology reviewed.  Objective: BP 104/70 (BP Location: Right Arm, Patient Position: Sitting, Cuff Size: Normal)   Pulse 78   Resp 16   Ht 5\' 2"  (1.575 m)   Wt 119 lb (54 kg)   LMP 08/22/2015 (Exact Date)   BMI 21.77 kg/m   EXAM General: alert and cooperative Resp: clear to auscultation bilaterally Cardio: regular rate and rhythm, S1, S2 normal, no murmur, click, rub or gallop GI: soft, non-tender; bowel sounds normal; no masses,  no organomegaly and incision: clean, dry and intact Extremities: extremities normal, atraumatic, no cyanosis or edema Vaginal Bleeding: none  Assessment: s/p TLH/Bilateral salpingectomy/cystoscopy, doing well Fatigue, appropriate for this point post op  Plan: Recheck 3 weeks

## 2015-09-23 ENCOUNTER — Encounter (HOSPITAL_COMMUNITY): Payer: Self-pay | Admitting: Obstetrics & Gynecology

## 2015-09-28 ENCOUNTER — Encounter: Payer: Self-pay | Admitting: Obstetrics & Gynecology

## 2015-09-28 NOTE — Telephone Encounter (Signed)
Encounter opened erroneously.   Closed encounter.   

## 2015-09-30 ENCOUNTER — Telehealth: Payer: Self-pay

## 2015-09-30 NOTE — Telephone Encounter (Signed)
Spoke with patient. Patient had a TAH, bilateral salpingectomy, and cystoscopy on 09/14/2015 with Dr.Miller. Patient states on Saturday 09/26/2015 that she began to have lower back pain and right lower quadrant pain "in my ovary." Denies any urinary symptoms, fever, or chills. Patient has returned to work and feels this could be related to increased activity. 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 Reports pain is a 6/10 before taking any medication. She is taking Motrin 600 mg every 6 hours which brings her pain to 3/10. Patient is asking if this is expected with returning to normal activities following surgery. Advised I will speak with covering provider and return call with further recommendations. She is agreeable.  Routing to [employer] for review and advise as Dr.Miller is out of the office today.  Cc: Dr.Miller

## 2015-09-30 NOTE — Telephone Encounter (Signed)
Spoke with patient. Advised of message as seen below from [place]. She is agreeable. Denies any significant pain or bleeding at this time. She will monitor her pain and if pain persists or increases or develops bleeding she will contact the office to be seen with Dr.Miller.   Cc: Dr.Miller  Routing to covering provider for final review. Patient agreeable to disposition. Will close encounter.

## 2015-09-30 NOTE — Telephone Encounter (Signed)
Left message to call Kaitlyn at [phone]. 

## 2015-09-30 NOTE — Telephone Encounter (Signed)
I suspect this may be due to increased activity as her surgery was very recent.  Can she reduce her activity? If she is having any significant pain or bleeding, needs office visit.  I am able to see her today or she may see Dr. Sabra Heck this week if needed. Otherwise, keep regularly scheduled appointment.   Cc- Dr. Sabra Heck

## 2015-09-30 NOTE — Telephone Encounter (Signed)
Please see telephone encounter dated with today's date 09/30/2015.

## 2015-09-30 NOTE — Telephone Encounter (Signed)
Left message to call [place] at [phone].  Visit Follow-Up Question  Message [id number]  From [place], MD Sent 09/28/2015 12:08 PM  I have been having more pain the past few days then I was. Is this to be expected? I have had a lot of back pain and pain on my right ovary (I think!) I have had to start back on the 800mg  motrin which isn't a big deal, but not the direction I was expecting. It may be just me getting back into daily life but just wanted to check in and see.   Responsible Party   Pool - [facility] No one has taken responsibility for this message.  No actions have been taken on this message.

## 2015-10-05 ENCOUNTER — Telehealth: Payer: Self-pay

## 2015-10-05 ENCOUNTER — Encounter: Payer: Self-pay | Admitting: Obstetrics & Gynecology

## 2015-10-05 ENCOUNTER — Ambulatory Visit (INDEPENDENT_AMBULATORY_CARE_PROVIDER_SITE_OTHER): Payer: 59 | Admitting: Obstetrics & Gynecology

## 2015-10-05 VITALS — BP 114/76 | HR 78 | Resp 16 | Ht 62.0 in | Wt 122.4 lb

## 2015-10-05 DIAGNOSIS — Z9181 History of falling: Secondary | ICD-10-CM

## 2015-10-05 DIAGNOSIS — R1031 Right lower quadrant pain: Secondary | ICD-10-CM

## 2015-10-05 NOTE — Telephone Encounter (Signed)
Spoke with patient at time of incoming call. Patient had a total laparoscopic hysterectomy on 09/14/2015 with Dr.Miller. Patient states that she woke up this morning and is having pain in her right side. Pain is sharp and began over night. Denies any nausea, vomiting, fever, or chills. Reports her incision sites look well. Denies any swelling or redness at the incision. Reports she fell last night around 9 pm while walking her dog and hit the ground pretty hard. States that the pain did not start until early this morning. She has not any medication for pain. Reports she is going to eat and take 800 mg of Ibuprofen. Advised she will need to be seen in the office for further evaluation. She is agreeable. Appointment scheduled for today at 11:15 am with Dr.Miller. Reports she has someone to drive her to and from her appointment.  Routing to provider for final review. Patient agreeable to disposition. Will close encounter.

## 2015-10-05 NOTE — Progress Notes (Signed)
GYNECOLOGY  VISIT   HPI: 40 y.o. [id number] Divorced Caucasian female with RLQ that has increased since this morning.  Pt reports she was out walking her dog last night, after dark, when she accidentally stepped into a hole that was almost two feet deep.  She fell on her right side.  She felt a little sore at first but otherwise felt fine.  She reports she starting having increased RLQ earlier this morning.  She denies nausea or emesis.  She is voiding well.  Reports RLQ feels a little "full" to her and pain is radiating around to her low back (or the other way around--R low back pain with radiation to RLQ).  Denies any vaginal bleeding.  Going to the beach on Wednesday so a little worried "this is something".  Patient Active Problem List   Diagnosis Date Noted  . 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 Anemia due to chronic blood loss 05/28/2015  . Pelvic pressure in female 05/28/2015  . ITP (idiopathic thrombocytopenic purpura) 09/27/2011    Past Medical History:  Diagnosis Date  . Abnormal Pap smear 1999   colpo 1999  . Anemia   . Anxiety   . Clotting disorder ([place])   . Cystic fibrosis carrier   . Fever blister   . H/O infertility   . IBS (irritable bowel syndrome)   . ITP (idiopathic thrombocytopenic purpura)    dx age 31, h/o splenectomy, nl value 80K  . Murmur    released from cardiologist as teenager    Past Surgical History:  Procedure Laterality Date  . BILATERAL SALPINGECTOMY Bilateral 09/14/2015   Procedure: BILATERAL SALPINGECTOMY;  Surgeon: Megan Salon, MD;  Location: [facility];  Service: Gynecology;  Laterality: Bilateral;  . CESAREAN SECTION  2002, 2004  . CYSTOSCOPY N/A 09/14/2015   Procedure: CYSTOSCOPY;  Surgeon: Megan Salon, MD;  Location: [facility];  Service: Gynecology;  Laterality: N/A;  . HYSTEROSCOPY WITH RESECTOSCOPE  7/13  . LAPAROSCOPIC HYSTERECTOMY N/A 09/14/2015   Procedure: HYSTERECTOMY TOTAL LAPAROSCOPIC;  Surgeon: Megan Salon, MD;  Location: [facility];   Service: Gynecology;  Laterality: N/A;  . LAPAROSCOPIC LYSIS OF ADHESIONS N/A 09/14/2015   Procedure: LAPAROSCOPIC LYSIS OF ADHESIONS;  Surgeon: Megan Salon, MD;  Location: [facility];  Service: Gynecology;  Laterality: N/A;  . right ankle bone spurs  90's  . SPLENECTOMY  2001    MEDS:  Reviewed in EPIC and UTD  ALLERGIES: Other  Family History  Problem Relation Age of Onset  . Heart disease Maternal Grandmother   . Hyperlipidemia Maternal Grandmother   . Breast cancer Maternal Grandmother 30  . Lung cancer Paternal Grandmother     lung  . Hypothyroidism Mother   . Hyperlipidemia Mother   . Hypertension Mother   . Migraines Mother   . Heart disease Father   . Other Daughter     conversion disorder    SH:  Divorced, non smoker  Review of Systems  Constitutional: Negative.   Gastrointestinal: Positive for abdominal pain (rlq). Negative for diarrhea, nausea and vomiting.  Genitourinary: Negative.   Musculoskeletal: Positive for back pain (lower right side).  Neurological: Negative.     PHYSICAL EXAMINATION:    BP 114/76 (BP Location: Right Arm, Patient Position: Sitting)   Pulse 78   Resp 16   Ht 5\' 2"  (1.575 m)   Wt 122 lb 6.4 oz (55.5 kg)   LMP 08/22/2015 (Exact Date)   BMI 22.39 kg/m     General appearance:  alert, cooperative and appears stated age Abdomen: soft, mild RLQ pain to palpation, no rebound or guarding; bowel sounds normal; no masses,  no organomegaly Flank:  No cva tenderness  Pelvic: External genitalia:  no lesions              Urethra:  normal appearing urethra with no masses, tenderness or lesions              Bartholins and Skenes: normal                 Vagina: normal appearing vagina with normal color and discharge, no lesions, cuff healing well, no fullness and non-tender              Cervix: absent              Bimanual Exam:  Uterus:  uterus absent              Adnexa: no mass, fullness, tenderness, tenderness is more with pressure on  abdomen   Assessment: Post op RLQ and right low back pain after a fall last night Non-surgical abdomen  Plan: Increase anti-inflammatories as well as narcotics if needed.  Pt does not need new Rx. CBC today Return to check ovary on right side, esp as pt is going out of town later this week

## 2015-10-06 ENCOUNTER — Other Ambulatory Visit: Payer: Self-pay | Admitting: Obstetrics & Gynecology

## 2015-10-06 ENCOUNTER — Telehealth: Payer: Self-pay | Admitting: Obstetrics & Gynecology

## 2015-10-06 ENCOUNTER — Ambulatory Visit (INDEPENDENT_AMBULATORY_CARE_PROVIDER_SITE_OTHER): Payer: 59

## 2015-10-06 ENCOUNTER — Ambulatory Visit (INDEPENDENT_AMBULATORY_CARE_PROVIDER_SITE_OTHER): Payer: 59 | Admitting: Obstetrics & Gynecology

## 2015-10-06 ENCOUNTER — Other Ambulatory Visit: Payer: Self-pay | Admitting: *Deleted

## 2015-10-06 VITALS — BP 118/66 | HR 102 | Resp 18 | Wt 121.2 lb

## 2015-10-06 DIAGNOSIS — Z9181 History of falling: Secondary | ICD-10-CM

## 2015-10-06 DIAGNOSIS — R1031 Right lower quadrant pain: Secondary | ICD-10-CM

## 2015-10-06 LAB — CBC
HEMATOCRIT: 36.7 % (ref 35.0–45.0)
HEMOGLOBIN: 11.9 g/dL (ref 11.7–15.5)
MCH: 29.5 pg (ref 27.0–33.0)
MCHC: 32.4 g/dL (ref 32.0–36.0)
MCV: 90.8 fL (ref 80.0–100.0)
RBC: 4.04 MIL/uL (ref 3.80–5.10)
RDW: 13.4 % (ref 11.0–15.0)
WBC: 6.8 10*3/uL (ref 3.8–10.8)

## 2015-10-06 NOTE — Telephone Encounter (Signed)
Patient is in office for ultrasound at this time. Please see OV note.  Will close encounter.

## 2015-10-06 NOTE — Telephone Encounter (Signed)
Patient has an ultrasound scheduled this afternoon and is feeling much better. Patient is asking if she still need to come to this appointment.

## 2015-10-06 NOTE — Progress Notes (Signed)
40 y.o. [id number] Divorced Caucasian female here for pelvic ultrasound due to RLQ pain that started after she fell on Sunday night while walking her dog.  She did have significant tenderness yesterday but states this is much better today.  In fact, she called earlier today to see if she could cancel this appt.  As she called last week with some increasing pelvic pain and she is going to the beach tomorrow, I felt it was best for her to proceed with evaluation.  Pt hasn't taken anything today for pain.  She reports she is just having soreness today and it is all in her back, not RLQ now.  Still denies vaginal bleeding.  Pt is about three weeks post op.  Patient's last menstrual period was 08/22/2015 (exact date).  Findings: UTERUS: surgically absent.  Vaginal cuff appears normal. EMS: n/a ADNEXA: Left ovary: 3.7 x 1.5 x 1.4cm       Right ovary: 2.7 x 1.8 x 2.1cm.  Normal follicular pattern on both ovaries CUL DE SAC: very small amt of free fluid which is consistent with post op status. Right kidney was imaged as well and is normal.  No fluid along right flank noted.  Discussion:  Findings reviewed with pt.  As she is feeling much better from pain perspective, she is reassured.  CBC from yesterday was normal.  Platelet ct could not be done due to platelet clumping.  Pt would like to cancel appt next week.  She is back at work full time and knows not to be SA for 8 full weeks post op.  Given improvement in pain and ultrasound today, feel this is ok.  She KNOWS to call with any new issues.  Assessment:  RLQ pain that occurred after a fall, much improved today.  Plan:  Pt will call with any new issues.  O/W she will follow up in 1 year for routine AEX.  ~15 minutes spent with patient >50% of time was in face to face discussion of above.

## 2015-10-06 NOTE — Telephone Encounter (Signed)
As she was having issues last week with pain and again this week, I'd really like to just check the right side if possible.  Thanks.

## 2015-10-06 NOTE — Telephone Encounter (Signed)
Patient was seen in the office yesterday for RLQ abdominal pain with recent fall history post operatively. She is scheduled for abdominal ultrasound today with PUS if needed. Patient is calling to report she is feeling much better today and would like to know if she needs to keep ultrasound appointment as scheduled.  Routing to [place] for review and advise.

## 2015-10-07 ENCOUNTER — Encounter: Payer: Self-pay | Admitting: Obstetrics & Gynecology

## 2015-10-13 ENCOUNTER — Ambulatory Visit: Payer: 59 | Admitting: Obstetrics & Gynecology

## 2015-10-23 ENCOUNTER — Ambulatory Visit: Payer: 59 | Admitting: Family

## 2015-10-23 ENCOUNTER — Other Ambulatory Visit: Payer: 59

## 2015-11-12 ENCOUNTER — Other Ambulatory Visit: Payer: Self-pay | Admitting: Obstetrics & Gynecology

## 2015-11-12 DIAGNOSIS — Z1231 Encounter for screening mammogram for malignant neoplasm of breast: Secondary | ICD-10-CM

## 2015-11-24 ENCOUNTER — Ambulatory Visit
Admission: RE | Admit: 2015-11-24 | Discharge: 2015-11-24 | Disposition: A | Discharge status: Home / Self Care | Payer: 59 | Source: Ambulatory Visit | Attending: Obstetrics & Gynecology | Admitting: Obstetrics & Gynecology

## 2015-11-24 DIAGNOSIS — Z1231 Encounter for screening mammogram for malignant neoplasm of breast: Secondary | ICD-10-CM

## 2016-01-20 ENCOUNTER — Ambulatory Visit (INDEPENDENT_AMBULATORY_CARE_PROVIDER_SITE_OTHER): Payer: 59 | Admitting: Nurse Practitioner

## 2016-01-20 ENCOUNTER — Encounter: Payer: Self-pay | Admitting: Nurse Practitioner

## 2016-01-20 ENCOUNTER — Telehealth: Payer: Self-pay | Admitting: Obstetrics & Gynecology

## 2016-01-20 VITALS — BP 120/76 | HR 72 | Ht 62.0 in | Wt 116.0 lb

## 2016-01-20 DIAGNOSIS — N760 Acute vaginitis: Secondary | ICD-10-CM | POA: Diagnosis not present

## 2016-01-20 MED ORDER — FLUCONAZOLE 150 MG PO TABS: 150.0000 mg | ORAL_TABLET | Freq: Once | ORAL | 0 refills | Status: AC

## 2016-01-20 NOTE — Telephone Encounter (Signed)
Spoke with patient. Patient states she has been experiencing vaginal itching for the past month that has recently gotten worse the last few days. Patient states she feels it is yeast, used 1 day Monistat treatment 4 days ago and feels that made symptoms worse. Patient denies vaginal discharge or any urinary complaints. Recommended OV for further evaluation, advised patient Dr. Sabra Heck is out of the office today, can schedule with a covering provider. Patient scheduled 01/20/16 at 3pm with Kem Boroughs, NP. Patient is agreeable to date and time.  Routing to provider for final review. Patient is agreeable to disposition. Will close encounter.  Cc: Dr. Sabra Heck

## 2016-01-20 NOTE — Patient Instructions (Signed)

## 2016-01-20 NOTE — Telephone Encounter (Signed)
Patient says she thinks she may have a yeast infection and wants something called in to her pharmacy.

## 2016-01-20 NOTE — Progress Notes (Signed)
Patient ID: Mary Duffy, female   DOB: September 08, 1975, 40 y.o.   MRN: [id number]  40 y.o. Divorced Caucasian female [phone] here with complaint of vaginal symptoms of itching,  and increase discharge. Describes discharge as clear to white but not a lot.  Onset of symptoms about one month ago, but worsened in the last 7 days. Denies new personal products or vaginal dryness.   No STD concerns. Urinary symptoms none . Contraception is post TLH/ BSO/ Cysto 09/14/15.  No other symptoms and no recent antibiotics.  No pain with SA.     O:  Healthy female WDWN Affect: normal, orientation x 3  Exam: no distress Abdomen: soft and non tender Lymph node: no enlargement or tenderness Pelvic exam: External genital: normal female BUS: negative Vagina: thick white to yellow discharge noted.  Affirm taken.   A: R/O Vaginitis - c/w yeast   P: Discussed findings of vaginitis and etiology. Discussed Aveeno or baking soda sitz bath for comfort. Avoid moist clothes or pads for extended period of time. If working out in gym clothes or swim suits for long periods of time change underwear or bottoms of swimsuit if possible. Olive Oil/Coconut Oil use for skin protection prior to activity can be used to external skin.  Rx: Started her on Diflucan today  Follow with Affirm  RV prn

## 2016-01-21 LAB — WET PREP BY MOLECULAR PROBE
CANDIDA SPECIES: NEGATIVE
GARDNERELLA VAGINALIS: NEGATIVE
Trichomonas vaginosis: NEGATIVE

## 2016-01-21 NOTE — Progress Notes (Signed)
Reviewed personally.  M. Suzanne Saanvika Vazques, MD.  

## 2016-06-24 ENCOUNTER — Ambulatory Visit (INDEPENDENT_AMBULATORY_CARE_PROVIDER_SITE_OTHER): Payer: 59 | Admitting: Family Medicine

## 2016-06-24 ENCOUNTER — Encounter: Payer: Self-pay | Admitting: Family Medicine

## 2016-06-24 VITALS — BP 110/80 | HR 95 | Resp 12 | Ht 62.0 in | Wt 112.1 lb

## 2016-06-24 DIAGNOSIS — Z131 Encounter for screening for diabetes mellitus: Secondary | ICD-10-CM | POA: Diagnosis not present

## 2016-06-24 DIAGNOSIS — Z9081 Acquired absence of spleen: Secondary | ICD-10-CM | POA: Diagnosis not present

## 2016-06-24 DIAGNOSIS — Z0000 Encounter for general adult medical examination without abnormal findings: Secondary | ICD-10-CM

## 2016-06-24 DIAGNOSIS — F319 Bipolar disorder, unspecified: Secondary | ICD-10-CM | POA: Diagnosis not present

## 2016-06-24 DIAGNOSIS — E7800 Pure hypercholesterolemia, unspecified: Secondary | ICD-10-CM | POA: Diagnosis not present

## 2016-06-24 DIAGNOSIS — Z23 Encounter for immunization: Secondary | ICD-10-CM

## 2016-06-24 DIAGNOSIS — E559 Vitamin D deficiency, unspecified: Secondary | ICD-10-CM | POA: Diagnosis not present

## 2016-06-24 DIAGNOSIS — D693 Immune thrombocytopenic purpura: Secondary | ICD-10-CM | POA: Diagnosis not present

## 2016-06-24 NOTE — Progress Notes (Signed)
HPI:   [name] is a 41 y.o. female, who is here today to establish care. She has no concerns today and she is due for her routine physical,so she agrees with routine examination today.  Former PCP: Ms Therapist, occupational at [employer], [place]. Last preventive routine visit: She follows gyn regularly s/p hysterectomy. Last CPE 2015.   Chronic medical problems: Bipolar disorder and anxiety,she is on Adderall 5 mg daily, Abilify 2.5 mg,and Lamictal 200 mg daily. She follows with psychiatrists every 3 months. 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 She is not sure about vaccination, reporting Tdap 5 years ago. Hx of vit D deficiency, she is not on Vit D supplementation. Mild HLD, last FLP in 2015 TC 209,LDL 92,HDL 102,and TG 74.   She lives with boyfriend, her 2 teen daughters and his 5 and 38 yo children.  Former smoker. She drinks 3-6 glasses (*oz glass) per week. She follows a healthy diet and exercises regularly.   Review of Systems  Constitutional: Negative for appetite change, fatigue and fever.  HENT: Negative for dental problem, hearing loss, mouth sores, nosebleeds, trouble swallowing and voice change.   Eyes: Negative for pain and visual disturbance.  Respiratory: Negative for cough, shortness of breath and wheezing.   Cardiovascular: Negative for chest pain and leg swelling.  Gastrointestinal: Negative for abdominal pain, blood in stool, nausea and vomiting.       No changes in bowel habits.  Endocrine: Negative for cold intolerance, heat intolerance, polydipsia, polyphagia and polyuria.  Genitourinary: Negative for decreased urine volume, dysuria, hematuria and vaginal bleeding.  Musculoskeletal: Negative for back pain, gait problem and neck pain.  Skin: Negative for color change and rash.  Neurological: Negative for seizures, syncope, weakness, numbness and headaches.  Hematological: Negative for adenopathy. Does  not bruise/bleed easily.  Psychiatric/Behavioral: Negative for confusion and sleep disturbance. The patient is not nervous/anxious.   All other systems reviewed and are negative.   No current outpatient prescriptions on file prior to visit.   No current facility-administered medications on file prior to visit.      Past Medical History:  Diagnosis Date  . Abnormal Pap smear 1999   colpo 1999  . Anemia   . Anxiety   . Clotting disorder ([place])   . Cystic fibrosis carrier   . Fever blister   . H/O infertility   . IBS (irritable bowel syndrome)   . ITP (idiopathic thrombocytopenic purpura)    dx age 89, h/o splenectomy, nl value 80K  . Murmur    released from cardiologist as teenager   Allergies  Allergen Reactions  . Other     IVIG-severe headache and neck stiffness    Family History  Problem Relation Age of Onset  . Heart disease Maternal Grandmother   . Hyperlipidemia Maternal Grandmother   . Breast cancer Maternal Grandmother 70  . Lung cancer Paternal Grandmother     lung  . Hypothyroidism Mother   . Hyperlipidemia Mother   . Hypertension Mother   . Migraines Mother   . Arthritis Mother   . Heart disease Father   . Arthritis Father   . Other Daughter     conversion disorder    Past Surgical History:  Procedure Laterality Date  . ABDOMINAL HYSTERECTOMY    . BILATERAL SALPINGECTOMY Bilateral 09/14/2015   Procedure: BILATERAL SALPINGECTOMY;  Surgeon: Megan Salon, MD;  Location: [facility];  Service: Gynecology;  Laterality: Bilateral;  . CESAREAN SECTION  2002, 2004  . CYSTOSCOPY N/A 09/14/2015   Procedure: CYSTOSCOPY;  Surgeon: Megan Salon, MD;  Location: [facility];  Service: Gynecology;  Laterality: N/A;  . HYSTEROSCOPY WITH RESECTOSCOPE  7/13  . LAPAROSCOPIC HYSTERECTOMY N/A 09/14/2015   Procedure: HYSTERECTOMY TOTAL LAPAROSCOPIC;  Surgeon: Megan Salon, MD;  Location: [facility];  Service: Gynecology;  Laterality: N/A;  . LAPAROSCOPIC LYSIS OF ADHESIONS N/A  09/14/2015   Procedure: LAPAROSCOPIC LYSIS OF ADHESIONS;  Surgeon: Megan Salon, MD;  Location: [facility];  Service: Gynecology;  Laterality: N/A;  . right ankle bone spurs  90's  . SPLENECTOMY  2001     Social History   Social History  . Marital status: Divorced    Spouse name: N/A  . Number of children: N/A  . Years of education: N/A   Social History Main Topics  . Smoking status: Former Smoker    Years: 10.00    Quit date: 03/28/2000  . Smokeless tobacco: Never Used  . Alcohol use 6.0 oz/week    10 Standard drinks or equivalent per week  . Drug use: No  . Sexual activity: Yes    Partners: Male    Birth control/ protection: Surgical   Other Topics Concern  . None   Social History Narrative  . None    Vitals:   06/24/16 1456  BP: 110/80  Pulse: 95  Resp: 12  O2 sat at RA 98%  Body mass index is 20.51 kg/m.  Physical Exam  Nursing note and vitals reviewed. Constitutional: She is oriented to person, place, and time. She appears well-developed and well-nourished. No distress.  HENT:  Head: Atraumatic.  Right Ear: Hearing and external ear normal.  Left Ear: Hearing and external ear normal.  Mouth/Throat: Uvula is midline, oropharynx is clear and moist and mucous membranes are normal.  Eyes: Conjunctivae and EOM are normal. Pupils are equal, round, and reactive to light.  Neck: No tracheal deviation present. No thyroid mass and no thyromegaly present.  Cardiovascular: Normal rate and regular rhythm.   Murmur (? Soft SEM LUSB) heard. Pulses:      Dorsalis pedis pulses are 2+ on the right side, and 2+ on the left side.       Posterior tibial pulses are 2+ on the right side, and 2+ on the left side.  Respiratory: Effort normal and breath sounds normal. No respiratory distress.  GI: Soft. She exhibits no mass. There is no hepatomegaly. There is no tenderness.  Musculoskeletal: She exhibits no edema or tenderness.  No major deformity or signs of synovitis appreciated.    Lymphadenopathy:    She has no cervical adenopathy.       Right: No supraclavicular adenopathy present.       Left: No supraclavicular adenopathy present.  Neurological: She is alert and oriented to person, place, and time. She has normal strength. No cranial nerve deficit. Coordination and gait normal.  Reflex Scores:      Bicep reflexes are 2+ on the right side and 2+ on the left side.      Patellar reflexes are 2+ on the right side and 2+ on the left side. Skin: Skin is warm. No rash noted. No erythema.  Psychiatric: She has a normal mood and affect. Her speech is normal. Cognition and memory are normal.  Well groomed, good eye contact.    ASSESSMENT AND PLAN:   Bijal was seen today for establish care.  Diagnoses and all  orders for this visit:  Routine physical examination   We discussed the importance of regular physical activity and healthy diet for prevention of chronic illness and/or complications. Preventive guidelines reviewed. Vaccination: Pneumovax given today.  Ca++ and vit D supplementation recommended. Next CPE in 1 year.  -     Comprehensive metabolic panel; Future -     Lipid panel; Future  H/O splenectomy -     Pneumococcal polysaccharide vaccine 23-valent greater than or equal to 2yo subcutaneous/IM  Pure hypercholesterolemia, unspecified  Mild. Continue low fat diet. Further recommendations will be given according to labs results.  F/U in 1-3 years.  -     Lipid panel; Future  Diabetes mellitus screening -     Comprehensive metabolic panel; Future  Vitamin D deficiency  Further recommendations will be given according to lab results.  -     VITAMIN D 25 Hydroxy (Vit-D Deficiency, Fractures); Future  Need for 23-polyvalent pneumococcal polysaccharide vaccine -     Pneumococcal polysaccharide vaccine 23-valent greater than or equal to 2yo subcutaneous/IM  Idiopathic thrombocytopenic purpura (HCC)  Continue following with Dr  Marin Olp.  Bipolar affective disorder, remission status unspecified ([place])  Stable. Continue following with psychiatrists, Dr Barbie Banner.     Betty G. Martinique, MD  [facility]. [place] office.

## 2016-06-24 NOTE — Patient Instructions (Signed)
A few things to remember from today's visit:   Routine physical examination - Plan: Comprehensive metabolic panel, Lipid panel  H/O splenectomy  Pure hypercholesterolemia, unspecified - Plan: Lipid panel  Diabetes mellitus screening - Plan: Comprehensive metabolic panel  Vitamin D deficiency - Plan: VITAMIN D 25 Hydroxy (Vit-D Deficiency, Fractures)     At least 150 minutes of moderate exercise per week, daily brisk walking for 15-30 min is a good exercise option. Healthy diet low in saturated (animal) fats and sweets and consisting of fresh fruits and vegetables, lean meats such as fish and white chicken and whole grains.   - Vaccines:  Tdap vaccine every 10 years.  Shingles vaccine recommended at age 17, could be given after 43 years of age but not sure about insurance coverage.  Pneumonia vaccines:  Pneumovax given today.  Screening recommendations for low/normal risk women:  Screening for diabetes at age 101-45 and every 3 years.  Cervical cancer prevention:  S/P hysterectomy  -Breast cancer: Mammogram: There is disagreement between experts about when to start screening in low risk asymptomatic female but recent recommendations are to start screening at 49 and not later than 43 years old , every 1-2 years and after 43 yo q 2 years. Screening is recommended until 43 years old but some women can continue screening depending of healthy issues.   Colon cancer screening: starts at 43 years old until 43 years old.  Cholesterol disorder screening at age 36 and every 3 years.  Also recommended:  1. Dental visit- Brush and floss your teeth twice daily; visit your dentist twice a year. 2. Eye doctor- Get an eye exam at least every 2 years. 3. Helmet use- Always wear a helmet when riding a bicycle, motorcycle, rollerblading or skateboarding. 4. Safe sex- If you may be exposed to sexually transmitted infections, use a condom. 5. Seat belts- Seat belts can save your live; always  wear one. 6. Smoke/Carbon Monoxide detectors- These detectors need to be installed on the appropriate level of your home. Replace batteries at least once a year. 7. Skin cancer- When out in the sun please cover up and use sunscreen 15 SPF or higher. 8. Violence- If anyone is threatening or hurting you, please tell your healthcare provider.  9. Drink alcohol in moderation- Limit alcohol intake to 3-4 oz or less per day. Never drink and drive.  How can I prevent infection? Your health care provider will recommend actions to help prevent infection. These may include:  Making sure that your immunizations are up-to-date, including:  Pneumococcus.  Seasonal flu (influenza).  Hib (Haemophilus influenzae type b).  Meningitis.  Making sure that vaccines are up-to-date for your family members.  Following good daily practices to prevent infection, such as:  Washing your hands often, especially after preparing food, eating, changing diapers, and playing with children or animals.  Disinfecting surfaces regularly.  Avoiding people who have active illness or infections.  Taking precautions to avoid insect bites, such as:  Wearing proper clothing that covers the entire body when you are in wooded or marshy areas.  Changing clothing right away and checking for bites after you have been outside.  Using insect spray.  Using insect netting.  Staying indoors during hours when mosquitoes are most active.  Taking precautions to avoid dog bites.  After a splenectomy, you may be at increased risk for rare infections that are associated with dog bites. What do I need to do if I must travel? If you travel in the  [place] take actions to avoid insect bites, especially in [place] and [place] areas. Insects can carry many viruses, and you may be at an increased risk of becoming sick from these viruses. You should also take precautions if you travel abroad to places where malaria is common. In  that case, follow these guidelines:  Contact your health care provider to get specific advice about the places that you will be visiting.  Get specific immunizations to guard against the disease risks in the country that you will be visiting.  Understand how to prevent infections, such as malaria, while you are abroad. These infections can pose serious risk. Precautions may include:  Daily tablets to prevent malaria.  Taking other precautions to prevent insect bites.  Bring broad-spectrum antibiotic medicines with you if they have been prescribed. What other things do I need to remember to do?  Take over-the-counter and prescription medicines only as told by your health care provider.  If you were prescribed an antibiotic:  Take it as told by your health care provider.  Do not stop taking the antibiotic even if you start to feel better.  Talk with your health care provider about the use of a probiotic supplement to prevent stomach upset.  Keep track of medicine refills so you do not run out of medicine.  Inform your close contacts of your condition. Consider wearing a medical alert bracelet or carrying an ID card.  Keep all follow-up visits as told by your health care provider. This is important. When should I seek medical care? Call your health care provider if:  You have a fever.  You have signs of infection that continue after taking an antibiotic. Signs may include chills and feeling unwell.  You are considering travel abroad.  You are bitten by a tick or a dog. When should I seek immediate medical care? Call 911 or go to the emergency department if:  You have chest pain along with:  Shortness of breath.  Pain in the back, neck, or jaw.  You have pain or swelling in the leg.  You develop a sudden headache and dizziness. This information is not intended to replace advice given to you by your health care provider. Make sure you discuss any questions you have with  your health care provider. Document Released: 07/28/2009 Document Revised: 07/13/2015 Document Reviewed: 10/21/2014 Elsevier Interactive Patient Education  2017 [place].  Please be sure medication list is accurate. If a new problem present, please set up appointment sooner than planned today.

## 2016-06-24 NOTE — Progress Notes (Signed)
Pre visit review using our clinic review tool, if applicable. No additional management support is needed unless otherwise documented below in the visit note. 

## 2016-06-29 ENCOUNTER — Encounter: Payer: Self-pay | Admitting: Family Medicine

## 2016-06-29 ENCOUNTER — Other Ambulatory Visit (INDEPENDENT_AMBULATORY_CARE_PROVIDER_SITE_OTHER): Payer: 59

## 2016-06-29 DIAGNOSIS — E7800 Pure hypercholesterolemia, unspecified: Secondary | ICD-10-CM | POA: Diagnosis not present

## 2016-06-29 DIAGNOSIS — E559 Vitamin D deficiency, unspecified: Secondary | ICD-10-CM

## 2016-06-29 DIAGNOSIS — Z131 Encounter for screening for diabetes mellitus: Secondary | ICD-10-CM

## 2016-06-29 DIAGNOSIS — Z0000 Encounter for general adult medical examination without abnormal findings: Secondary | ICD-10-CM

## 2016-06-29 LAB — LIPID PANEL
CHOLESTEROL: 169 mg/dL (ref 0–200)
HDL: 81.9 mg/dL (ref >39.00)
LDL Cholesterol: 74 mg/dL (ref 0–99)
NonHDL: 86.77
TRIGLYCERIDES: 63 mg/dL (ref 0.0–149.0)
Total CHOL/HDL Ratio: 2
VLDL: 12.6 mg/dL (ref 0.0–40.0)

## 2016-06-29 LAB — COMPREHENSIVE METABOLIC PANEL
ALBUMIN: 4.6 g/dL (ref 3.5–5.2)
ALK PHOS: 45 U/L (ref 39–117)
ALT: 10 U/L (ref 0–35)
AST: 16 U/L (ref 0–37)
BILIRUBIN TOTAL: 0.6 mg/dL (ref 0.2–1.2)
BUN: 10 mg/dL (ref 6–23)
CALCIUM: 9.1 mg/dL (ref 8.4–10.5)
CO2: 26 meq/L (ref 19–32)
Chloride: 106 mEq/L (ref 96–112)
Creatinine, Ser: 0.76 mg/dL (ref 0.40–1.20)
GFR: 88.31 mL/min (ref >60.00)
Glucose, Bld: 96 mg/dL (ref 70–99)
Potassium: 3.8 mEq/L (ref 3.5–5.1)
SODIUM: 140 meq/L (ref 135–145)
TOTAL PROTEIN: 7 g/dL (ref 6.0–8.3)

## 2016-06-29 LAB — VITAMIN D 25 HYDROXY (VIT D DEFICIENCY, FRACTURES): VITD: 34.68 ng/mL (ref 30.00–100.00)

## 2016-10-10 ENCOUNTER — Ambulatory Visit: Payer: 59 | Admitting: Obstetrics & Gynecology

## 2016-11-10 ENCOUNTER — Encounter: Payer: Self-pay | Admitting: Family Medicine

## 2017-05-20 IMAGING — CR DG CHEST 2V
2 series · 2 of 2 positions shown · non-contrast
Comparison: 12/29/2004

CLINICAL DATA: Cough 2 weeks with flu-like symptoms and low-grade
fever.

EXAM:
CHEST  2 VIEW

[w chest pa]
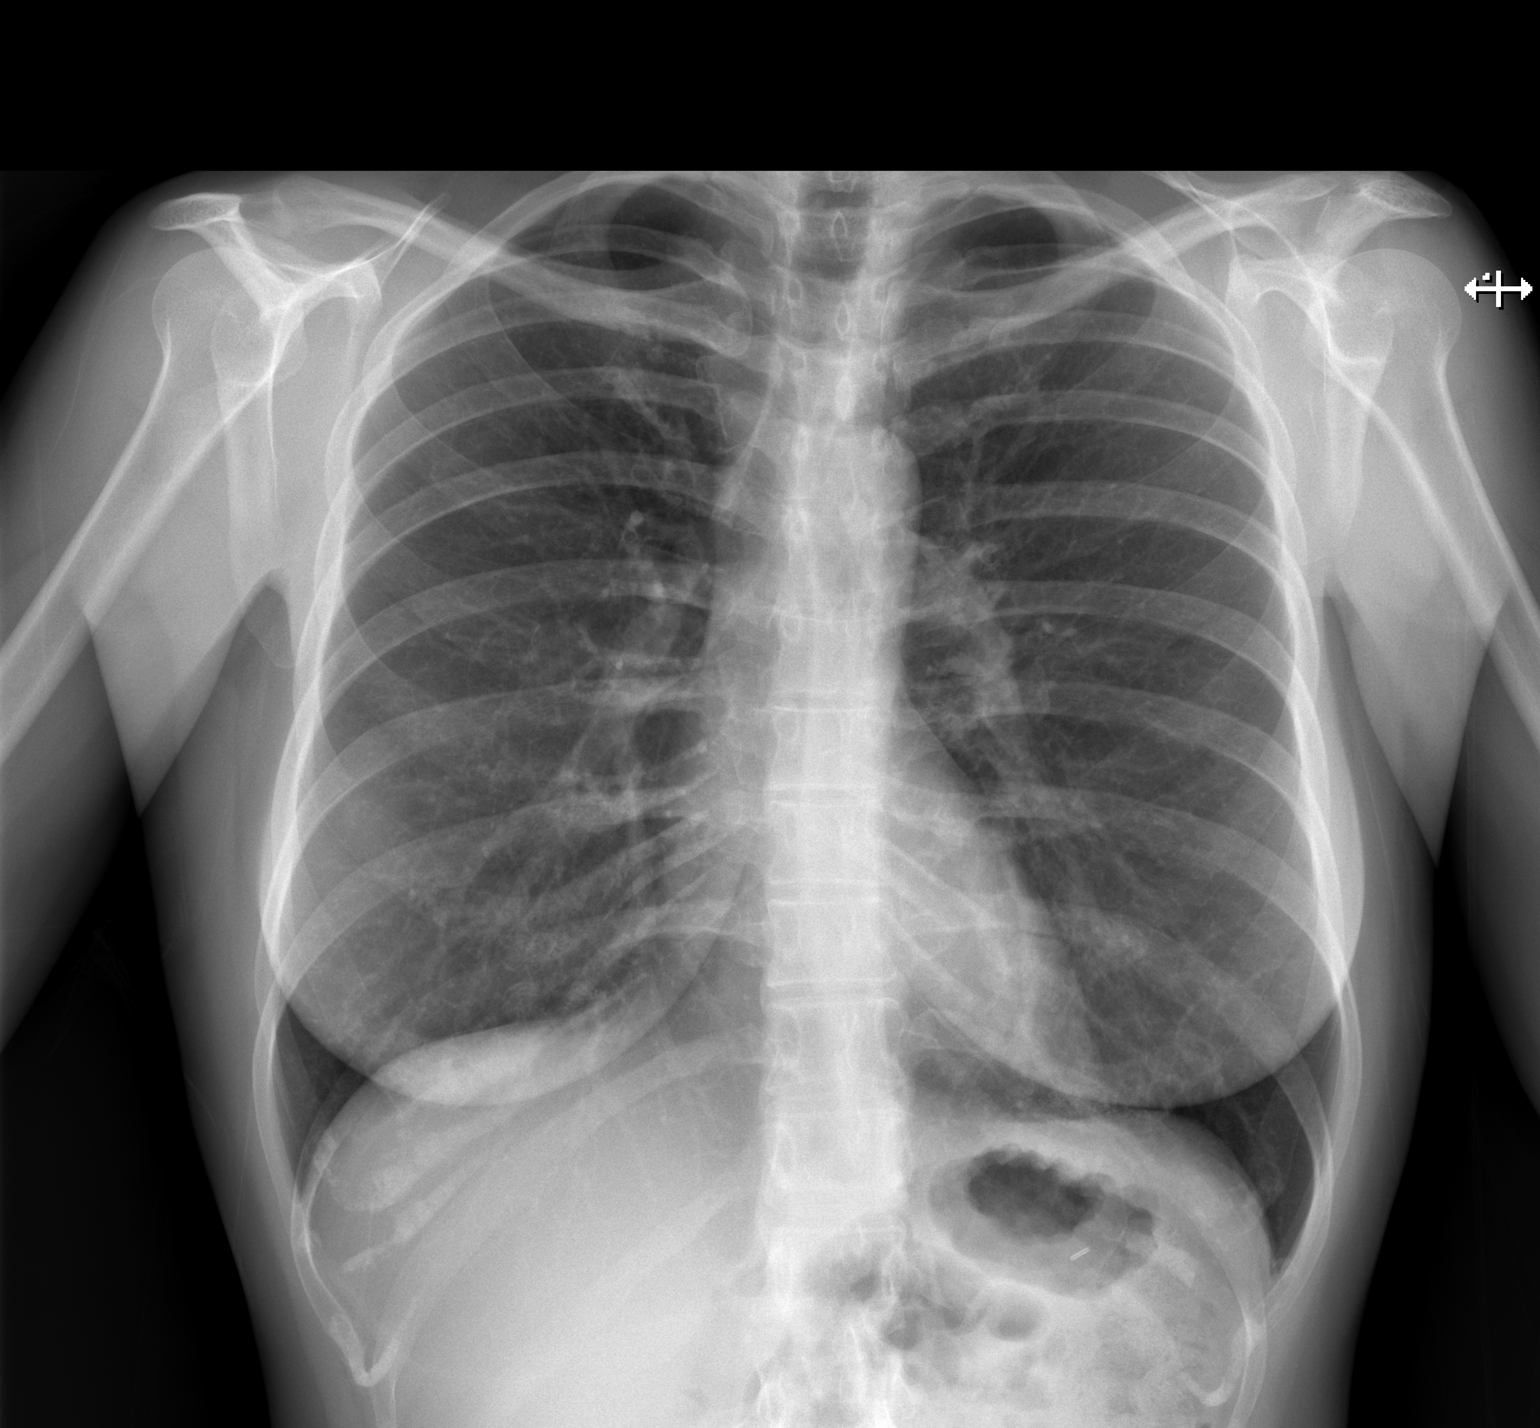

[w chest lat]
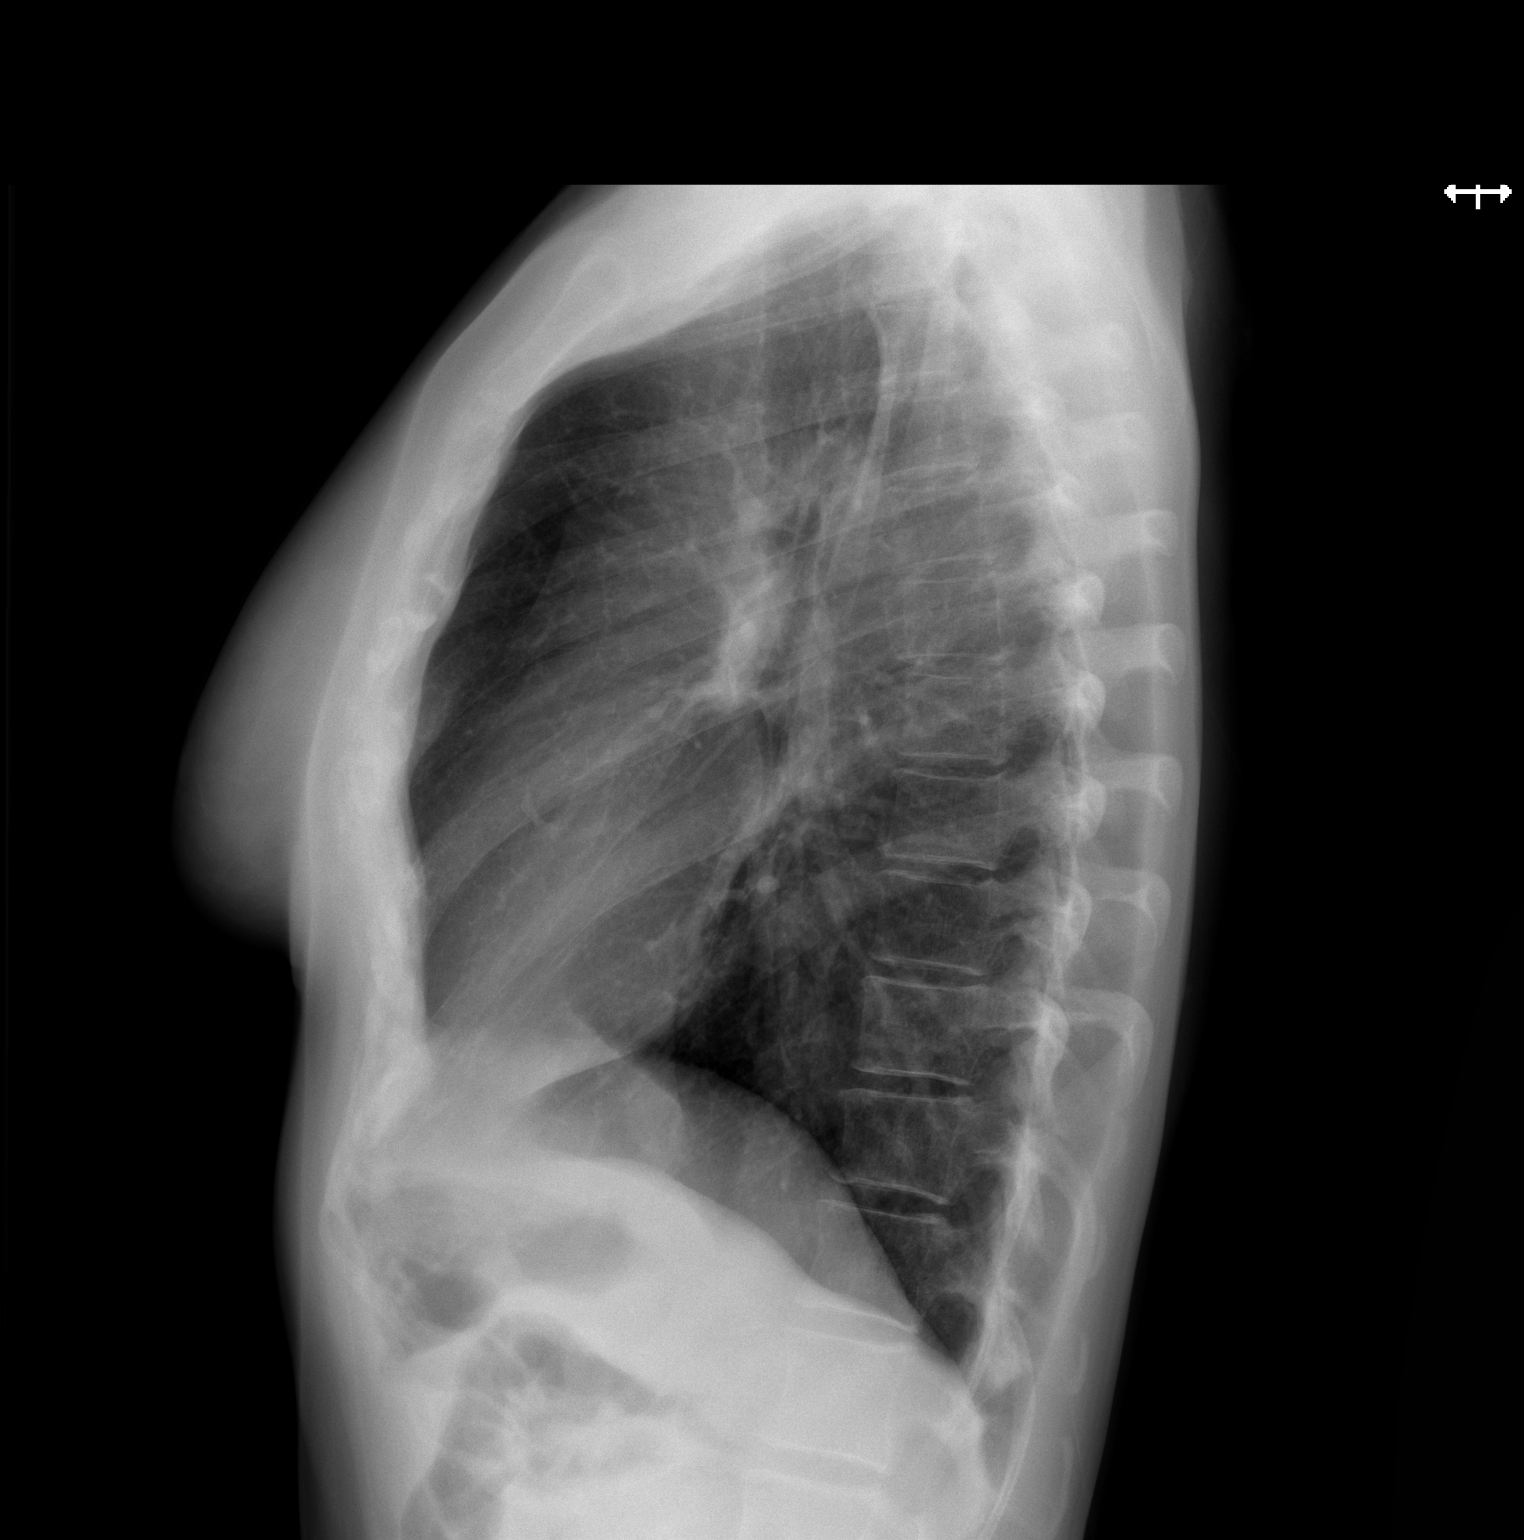

[2 of 2 positions shown; findings below may reference images not displayed]

FINDINGS: Lungs are adequately inflated without focal consolidation or
effusion. Cardiomediastinal silhouette is within normal. Remaining
bones and soft tissues are unremarkable.
IMPRESSION: No active cardiopulmonary disease.
